# Patient Record
Sex: Male | Born: 1970
Health system: Southern US, Community
[De-identification: ages and names within clinical notes are randomized; demographics above are authoritative.]

## PROBLEM LIST (undated history)

## (undated) DIAGNOSIS — F411 Generalized anxiety disorder: Secondary | ICD-10-CM

## (undated) DIAGNOSIS — G2579 Other drug induced movement disorders: Secondary | ICD-10-CM

## (undated) DIAGNOSIS — F410 Panic disorder [episodic paroxysmal anxiety] without agoraphobia: Secondary | ICD-10-CM

## (undated) DIAGNOSIS — F32A Depression, unspecified: Secondary | ICD-10-CM

## (undated) DIAGNOSIS — F329 Major depressive disorder, single episode, unspecified: Secondary | ICD-10-CM

## (undated) HISTORY — DX: Depression, unspecified: F32.A

## (undated) HISTORY — DX: Panic disorder (episodic paroxysmal anxiety): F41.0

## (undated) HISTORY — DX: Generalized anxiety disorder: F41.1

## (undated) HISTORY — DX: Major depressive disorder, single episode, unspecified: F32.9

## (undated) HISTORY — DX: Other drug induced movement disorders: G25.79

---

## 2014-05-23 ENCOUNTER — Ambulatory Visit (INDEPENDENT_AMBULATORY_CARE_PROVIDER_SITE_OTHER): Payer: 59 | Admitting: Psychology

## 2014-05-23 DIAGNOSIS — F411 Generalized anxiety disorder: Secondary | ICD-10-CM

## 2014-05-23 DIAGNOSIS — F332 Major depressive disorder, recurrent severe without psychotic features: Secondary | ICD-10-CM

## 2014-05-23 DIAGNOSIS — F4001 Agoraphobia with panic disorder: Secondary | ICD-10-CM

## 2014-06-20 ENCOUNTER — Encounter: Payer: Self-pay | Admitting: Family Medicine

## 2014-06-20 ENCOUNTER — Ambulatory Visit (INDEPENDENT_AMBULATORY_CARE_PROVIDER_SITE_OTHER): Payer: 59 | Admitting: Family Medicine

## 2014-06-20 VITALS — BP 129/82 | HR 73 | Temp 98.7°F | Resp 18 | Ht 67.0 in | Wt 146.0 lb

## 2014-06-20 DIAGNOSIS — F411 Generalized anxiety disorder: Secondary | ICD-10-CM

## 2014-06-20 DIAGNOSIS — F331 Major depressive disorder, recurrent, moderate: Secondary | ICD-10-CM

## 2014-06-20 DIAGNOSIS — F410 Panic disorder [episodic paroxysmal anxiety] without agoraphobia: Secondary | ICD-10-CM

## 2014-06-20 MED ORDER — CLONAZEPAM 1 MG PO TBDP: ORAL_TABLET | ORAL | Status: DC

## 2014-06-20 MED ORDER — PAROXETINE HCL 30 MG PO TABS: 30.0000 mg | ORAL_TABLET | Freq: Every day | ORAL | Status: DC

## 2014-06-20 NOTE — Progress Notes (Signed)
Pre visit review using our clinic review tool, if applicable. No additional management support is needed unless otherwise documented below in the visit note. 

## 2014-06-20 NOTE — Progress Notes (Signed)
Office Note 06/20/2014  CC:  Chief Complaint  Patient presents with  . Establish Care    transfer Dr. Loreta AveMann, [facility]    HPI:  Allen Conley is a 43 y.o. White male who is here to establish care, discuss panic attacks. Patient's most recent primary MD: [facility] in [place], [place]. Old records were not reviewed prior to or during today's visit.  He describes a couple year history of episodes of severe anxiety that builds rapidly and starts giving him SOB, tremulousness, feeling of impending doom, sx's peak in 20 min or so, resolve completely in < 1 hr. Worse when he has nothing to do or nothing to keep his mind busy.  Seems to be worse for last couple of months. No adverse life circumstances that patient is worried or depressed about.  Also feeling long hx (years) of persistently sad/depressed mood, crying spells, hopelessness, also chronic worry, poor self esteem.  No hx of euphoria/hypomania/mania but admits to irritability and some excessive anger at times. Has not seen a psychiatrist in the past.  Started seeing a counselor with [facility] in [place]--has a f/u appt scheduled.  Says CPE with labs and EKG about 2 months ago was normal [facility]).   Past Medical History  Diagnosis Date  . Depression     zoloft no help   . Panic attacks   . GAD (generalized anxiety disorder)     History reviewed. No pertinent past surgical history.  Family History  Problem Relation Age of Onset  . Cancer Maternal Grandmother   . Cancer Maternal Grandfather   . Depression Mother     History   Social History  . Marital Status: Single    Spouse Name: N/A    Number of Children: N/A  . Years of Education: N/A   Occupational History  . Not on file.   Social History Main Topics  . Smoking status: Never Smoker   . Smokeless tobacco: Never Used  . Alcohol Use: Yes     Comment: socially  . Drug Use: No  . Sexual Activity: Not on file   Other Topics Concern  . Not on  file   Social History Narrative   Single, no children.   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   OCC: Regulatory affairs officernventory analyst for [employer]   Lives in [place], [place].   No tobacco, occ alcohol (beer).   No hx of alc abuse or drug use.   Exercise: lifts weights 5 d/week, runs Sat/Sun   MEDS: sertraline 50mg , 2 tabs po qd  No Known Allergies  ROS As per HPI PE; Blood pressure 129/82, pulse 73, temperature 98.7 F (37.1 C), temperature source Temporal, resp. rate 18, height 5\' 7"  (1.702 m), weight 146 lb (66.225 kg), SpO2 98.00%. Gen: Alert, well appearing.  Patient is oriented to person, place, time, and situation. AFFECT: pleasant, lucid thought and speech. WUJ:WJXBENT:Eyes: no injection, icteris, swelling, or exudate.  EOMI, PERRLA. Mouth: lips without lesion/swelling.  Oral mucosa pink and moist. Oropharynx without erythema, exudate, or swelling.  Neck - No masses or thyromegaly or limitation in range of motion CV: RRR, no m/r/g.   LUNGS: CTA bilat, nonlabored resps, good aeration in all lung fields. EXT: no clubbing, cyanosis, or edema.  Neuro: CN 2-12 intact bilaterally, strength 5/5 in proximal and distal upper extremities and lower extremities bilaterally.  No sensory deficits.  Trace UE tremor noted in fingers when arms held outstretched.  No disdiadochokinesis.  No ataxia.  Upper extremity and lower  extremity DTRs symmetric.  No pronator drift.  Pertinent labs:  None today  ASSESSMENT AND PLAN:   New pt: obtain old records.  Major depression, moderate, recurrent. Also with GAD with panic disorder.  Plan is to continue with the counselor he recently established with at [facility]. Ween off zoloft over the next 10d:Take one 50mg  zoloft tab once a day for 5d, then take 1/2 of 50mg  zoloft tab once a day for 5d, then stop zoloft. Start paroxetine 30mg  qd (now). Discussed prn use of clonazepam 1mg / 1/2-1 tab bid on his bad anxiety days. Therapeutic expectations and side effect profile of medications discussed today.   Patient's questions answered.  An After Visit Summary was printed and given to the patient.  Return in about 3 weeks (around 07/11/2014) for f/u mood d/o and anxiety d/o.

## 2014-06-20 NOTE — Patient Instructions (Signed)
Take one 50mg  zoloft tab once a day for 5d, then take 1/2 of 50mg  zoloft tab once a day for 5d, then stop zoloft.

## 2014-07-01 ENCOUNTER — Ambulatory Visit (INDEPENDENT_AMBULATORY_CARE_PROVIDER_SITE_OTHER): Payer: 59 | Admitting: Psychology

## 2014-07-01 DIAGNOSIS — F332 Major depressive disorder, recurrent severe without psychotic features: Secondary | ICD-10-CM

## 2014-07-01 DIAGNOSIS — F4001 Agoraphobia with panic disorder: Secondary | ICD-10-CM

## 2014-07-01 DIAGNOSIS — F411 Generalized anxiety disorder: Secondary | ICD-10-CM

## 2014-07-12 ENCOUNTER — Ambulatory Visit (INDEPENDENT_AMBULATORY_CARE_PROVIDER_SITE_OTHER): Payer: 59 | Admitting: Family Medicine

## 2014-07-12 ENCOUNTER — Encounter: Payer: Self-pay | Admitting: Family Medicine

## 2014-07-12 ENCOUNTER — Ambulatory Visit: Payer: PRIVATE HEALTH INSURANCE | Admitting: Family Medicine

## 2014-07-12 VITALS — BP 142/86 | HR 66 | Temp 99.0°F | Resp 18 | Ht 67.0 in | Wt 150.0 lb

## 2014-07-12 DIAGNOSIS — F410 Panic disorder [episodic paroxysmal anxiety] without agoraphobia: Secondary | ICD-10-CM

## 2014-07-12 DIAGNOSIS — F331 Major depressive disorder, recurrent, moderate: Secondary | ICD-10-CM

## 2014-07-12 DIAGNOSIS — F411 Generalized anxiety disorder: Secondary | ICD-10-CM

## 2014-07-12 DIAGNOSIS — F329 Major depressive disorder, single episode, unspecified: Secondary | ICD-10-CM | POA: Insufficient documentation

## 2014-07-12 MED ORDER — PAROXETINE HCL 30 MG PO TABS: ORAL_TABLET | ORAL | Status: DC

## 2014-07-12 MED ORDER — CLONAZEPAM 1 MG PO TBDP: ORAL_TABLET | ORAL | Status: DC

## 2014-07-12 NOTE — Progress Notes (Signed)
OFFICE NOTE  07/12/2014  CC:  Chief Complaint  Patient presents with  . Follow-up  . Depression    increase paxil?     HPI: Patient is a 43 y.o. Caucasian male who is here for 3 wk f/u anxiety. Started paxil 30mg  qd and weened him off zoloft last visit. Also started clonaz bid PRN.   He went to his 2nd counseling appt, has another f/u. Had recent breakup with GF and this has him very down, hopeless, constantly thinking about it and worrying about everything.  He feels like he won't ever feel better.  Denies suicidal thoughts or plans. Clonaz 1 and 1/2 mg helps but less doesn't.  He has run out of this med.   Pertinent PMH:  Past medical, surgical, social, and family history reviewed and no changes are noted since last office visit.  MEDS:  Outpatient Prescriptions Prior to Visit  Medication Sig Dispense Refill  . clonazePAM (KLONOPIN) 1 MG disintegrating tablet 1/2-1 tab po bid prn anxiety/panic  30 tablet  0  . PARoxetine (PAXIL) 30 MG tablet Take 1 tablet (30 mg total) by mouth daily.  30 tablet  1   No facility-administered medications prior to visit.    PE: Blood pressure 142/86, pulse 66, temperature 99 F (37.2 C), temperature source Temporal, resp. rate 18, height 5\' 7"  (1.702 m), weight 150 lb (68.04 kg), SpO2 97.00%. Gen: Alert, well appearing.  Patient is oriented to person, place, time, and situation. AFFECT: nervous, eye contact is good, talks constantly about how he doesn't think he'll ever feel better. No crying.  Lucid thought and conversation.    IMPRESSION AND PLAN:  Major depression and GAD with panic. Recent life circumstance has caused acute worsening.  Gave emotional support and discussed coping strategies. Will also increase paxil to 60 mg daily and clonaz to 1-2 mg bid prn. Keep counseling appt next week, also f/u here with me in 1 wk.  An After Visit Summary was printed and given to the patient.

## 2014-07-12 NOTE — Patient Instructions (Signed)
BE AWARE OF YOUR "SELF TALK".  Start to identify the truth or LACK of truth in your self talk.  Start practicing POSITIVE self talk.  PRACTICE PRACTICE PRACTICE.

## 2014-07-12 NOTE — Progress Notes (Signed)
Pre visit review using our clinic review tool, if applicable. No additional management support is needed unless otherwise documented below in the visit note. 

## 2014-07-19 ENCOUNTER — Ambulatory Visit (INDEPENDENT_AMBULATORY_CARE_PROVIDER_SITE_OTHER): Payer: 59 | Admitting: Family Medicine

## 2014-07-19 ENCOUNTER — Encounter: Payer: Self-pay | Admitting: Family Medicine

## 2014-07-19 VITALS — BP 121/73 | HR 73 | Temp 99.0°F | Resp 18 | Ht 67.0 in | Wt 148.0 lb

## 2014-07-19 DIAGNOSIS — F411 Generalized anxiety disorder: Secondary | ICD-10-CM

## 2014-07-19 DIAGNOSIS — F331 Major depressive disorder, recurrent, moderate: Secondary | ICD-10-CM

## 2014-07-19 DIAGNOSIS — F410 Panic disorder [episodic paroxysmal anxiety] without agoraphobia: Secondary | ICD-10-CM

## 2014-07-19 NOTE — Progress Notes (Signed)
Pre visit review using our clinic review tool, if applicable. No additional management support is needed unless otherwise documented below in the visit note. 

## 2014-07-19 NOTE — Progress Notes (Signed)
OFFICE NOTE  07/19/2014  CC:  Chief Complaint  Patient presents with  . Follow-up  . Medication Problem    may need to increase Paxil    HPI: Patient is a 43 y.o. Caucasian male who is here for 1 week f/u anxiety/depression.   Last week I increased his paxil to 60mg  qd and increased the dosing of his clonazepam, however b/c of confusion with pharmacy he has still only been on 30mg  paxil qd.  The nervousness/anxiety is helped somewhat by the clonaz when he takes 2 of the 1mg  tabs.   Still feeling very down/hopeless, sleeping more, eating less, drinking alcohol more (3-4 times per week). He drinks to get a buzz, doesn't get drunk or black out.  Denies drug use. Denies suicidal thoughts or homicidal thoughts.  Pertinent PMH:  Past medical, surgical, social, and family history reviewed and no changes are noted since last office visit.  MEDS:  Outpatient Prescriptions Prior to Visit  Medication Sig Dispense Refill  . clonazePAM (KLONOPIN) 1 MG disintegrating tablet 1/2-1 tab po bid prn anxiety/panic  60 tablet  0  . PARoxetine (PAXIL) 30 MG tablet 2 tabs po qd  60 tablet  1   No facility-administered medications prior to visit.    PE: Blood pressure 121/73, pulse 73, temperature 99 F (37.2 C), temperature source Temporal, resp. rate 18, height 5\' 7"  (1.702 m), weight 148 lb (67.132 kg), SpO2 98.00%. Gen: Alert, well appearing.  Patient is oriented to person, place, time, and situation. AFFECT: flat, no crying.  Thoughts and conversation are lucid.   IMPRESSION AND PLAN:  Depression and anxiety/panic. Still struggling but he needs to increase paxil to 60 qd as we intended to last week. May take clonaz 1mg  tid, occ may take 2mg  dose after he has been on 1mg  dosing for 5d or so. Advised pt to NOT drink alcohol right now. Continue with counselor.  Start journaling. An After Visit Summary was printed and given to the patient.   FOLLOW UP: 2 wks

## 2014-07-26 ENCOUNTER — Encounter (HOSPITAL_COMMUNITY): Payer: Self-pay | Admitting: Psychology

## 2014-07-26 ENCOUNTER — Ambulatory Visit (INDEPENDENT_AMBULATORY_CARE_PROVIDER_SITE_OTHER): Payer: 59 | Admitting: Psychology

## 2014-07-26 DIAGNOSIS — F4001 Agoraphobia with panic disorder: Secondary | ICD-10-CM

## 2014-07-26 DIAGNOSIS — F411 Generalized anxiety disorder: Secondary | ICD-10-CM

## 2014-07-26 DIAGNOSIS — F332 Major depressive disorder, recurrent severe without psychotic features: Secondary | ICD-10-CM

## 2014-07-30 ENCOUNTER — Encounter (HOSPITAL_COMMUNITY): Payer: Self-pay | Admitting: Psychology

## 2014-07-30 NOTE — Progress Notes (Signed)
   PROGRESS NOTE     Patient:  Allen Conley   DOB: 1971-04-28  MR Number: [id number]  Location: [facility] [address] [place] [address] Dept: [phone]  Start: 10 AM End: 11 AM  Provider/Observer:     Hershal CoriaJohn R Rodenbough PSYD  Chief Complaint:      Chief Complaint  Patient presents with  . Anxiety  . Depression  . Panic Attack    Reason For Service:    The patient was referred for psychotherapeutic interventions for depression and anxiety. He is a 43 year old Caucasian male who has been dealing with anxiety significant 5 or more years. The patient describes depression and anxiety as well as racing thoughts and feelings of hopelessness. The patient reports were gradually to catch the people too quickly and these relationships fall apart. He also describes symptoms consistent with panic attacks in the past and it resurface. The patient reports that he always lived with his grandmother and then when the patient was 43 years old his grandmother started getting sick. The patient to stay in her home and take care of her and she was very sick and not wanting to move to a nursing home. The grandmother passed away 2 years ago and since that time the patient has been laid off from work. Do to financial difficulties from his loss of job need to move in with his mother and family. The patient reports that the situation was very chaotic but that he has been able to move out he was on more recently. Currently, he reports he got a good job and is living on his on doing okay financially.   Interventions Strategy:  Cognitive/behavioral psychotherapeutic interventions  Participation Level:   Active  Participation Quality:  Appropriate      Behavioral Observation:  Well Groomed, Alert, and Appropriate.   Current Psychosocial Factors: The patient reports that he has had more panic anxiety recently has  begun working therapeutic interventions we have been developing. The patient reports that he has been working on his diet, exercise, and sleep patterns as well. The patient reports that he is also been trying to figure out ways that he can develop times where he is around others.  Content of Session:   Review current symptoms and continue to work on therapeutic interventions.  Current Status:   The patient reports increased anxiety and depression have persisted.  Patient Progress:   Stable with active participation by the patient.  Target Goals:   Target goals include reducing the intensity, severity, and duration of symptoms of depression, anxiety and frequency of panic attacks.  Last Reviewed:   07/01/2014  Goals Addressed Today:    Goals addressed today have to do with panic symptoms primarily but also with recurrent symptoms of depression.  Impression/Diagnosis:  The patient has a long history of very poor self-esteem difficult to establish major and significant relationships. The patient reports that his issues of self-esteem leave her vulnerable to try to make quick relationships and confusion around how to establish romantic relationships. The patient has a prior history of panic attacks and this does appear to resurface. He also describes symptoms consistent with recurrent major depression without psychosis as well as generalized anxiety disorder.   Diagnosis:    Axis I: Major depressive disorder, recurrent episode, severe, without mention of psychotic behavior  Generalized anxiety disorder  Panic disorder with agoraphobia

## 2014-07-30 NOTE — Progress Notes (Signed)
PROGRESS NOTE     Patient:  Allen Conley   DOB: February 19, 1971  MR Number: [id number]  Location: BE[facility]IORAL [facility] [address] [place] [address] Dept: [phone]  Start: 10 AM End: 11 AM  Provider/Observer:     Hershal Coria PSYD  Chief Complaint:      Chief Complaint  Patient presents with  . Anxiety  . Depression  . Panic Attack    Reason For Service:    The patient was referred for psychotherapeutic interventions for depression and anxiety. He is a 44 year old Caucasian male who has been dealing with anxiety significant 5 or more years. The patient describes depression and anxiety as well as racing thoughts and feelings of hopelessness. The patient reports were gradually to catch the people too quickly and these relationships fall apart. He also describes symptoms consistent with panic attacks in the past and it resurface. The patient reports that he always lived with his grandmother and then when the patient was 79 years old his grandmother started getting sick. The patient to stay in her home and take care of her and she was very sick and not wanting to move to a nursing home. The grandmother passed away 2 years ago and since that time the patient has been laid off from work. Do to financial difficulties from his loss of job need to move in with his mother and family. The patient reports that the situation was very chaotic but that he has been able to move out he was on more recently. Currently, he reports he got a good job and is living on his on doing okay financially.   Interventions Strategy:  Cognitive/behavioral psychotherapeutic interventions  Participation Level:   Active  Participation Quality:  Appropriate      Behavioral Observation:  Well Groomed, Alert, and Appropriate.   Current Psychosocial Factors: The patient reports that he has had a very challenging interaction  with the friend of his that he had state time with in communicating with at work. About a month ago she started dating someone and while the patient had realized that he was developing some degree of feelings for her and never really thought about trying to ask around her start taking her. However, something happened and she sent him attacks and that she could no longer communicate with him. He did not have any other information and reports a lot of different ideas targets were within his head about what happened. He has not been able to clarify that with her but we did use it is therapeutic starting point..  Content of Session:   Review current symptoms and continue to work on therapeutic interventions.  Current Status:   The patient reports increased anxiety and depression have persisted.  Patient Progress:   Stable with active participation by the patient.  Target Goals:   Target goals include reducing the intensity, severity, and duration of symptoms of depression, anxiety and frequency of panic attacks.  Last Reviewed:   07/26/2014  Goals Addressed Today:    Goals addressed today have to do with panic symptoms primarily but also with recurrent symptoms of depression.  Impression/Diagnosis:  The patient has a long history of very poor self-esteem difficult to establish major and significant relationships. The patient reports that his issues of self-esteem leave her vulnerable to try to make quick relationships and confusion around how to establish romantic relationships. The patient has a prior history of panic  attacks and this does appear to resurface. He also describes symptoms consistent with recurrent major depression without psychosis as well as generalized anxiety disorder.   Diagnosis:    Axis I: Major depressive disorder, recurrent episode, severe, without mention of psychotic behavior  Generalized anxiety disorder  Panic disorder with agoraphobia

## 2014-07-30 NOTE — Progress Notes (Signed)
Patient:   Allen Conley   DOB:   25-Mar-1971  MR Number:  [id number]  Location:  [facility] [address] [place] [id number] Dept: [phone]           Date of Service:   05/23/2014  Start Time:   11 AM End Time:   12 PM  Provider/Observer:  Hershal CoriaJohn R Lakiya Cottam PSYD       Billing Code/Service: [phone]  Chief Complaint:     Chief Complaint  Patient presents with  . Anxiety  . Panic Attack    Reason for Service:  The patient was referred for psychotherapeutic interventions for depression and anxiety. He is a 43 year old Caucasian male who has been dealing with anxiety significant 5 or more years. The patient describes depression and anxiety as well as racing thoughts and feelings of hopelessness. The patient reports were gradually to catch the people too quickly and these relationships fall apart. He also describes symptoms consistent with panic attacks in the past and it resurface. The patient reports that he always lived with his grandmother and then when the patient was 43 years old his grandmother started getting sick. The patient to stay in her home and take care of her and she was very sick and not wanting to move to a nursing home. The grandmother passed away 2 years ago and since that time the patient has been laid off from work. Do to financial difficulties from his loss of job need to move in with his mother and family. The patient reports that the situation was very chaotic but that he has been able to move out he was on more recently. Currently, he reports he got a good job and is living on his on doing okay financially.  Current Status:  The patient describes moderate to significant symptoms of depression, anxiety, mood changes, racing thoughts, loss of interest, excessive worrying, intrusive and obsessive thinking, panic attacks, and other problems with attention and  focus.  Reliability of Information: Information is provided patient as well as review of available medical.  Behavioral Observation: Allen JuneAlex N. Ephriam Conley  presents as a 43 y.o.-year-old Right Caucasian Male who appeared his stated age. his dress was Appropriate and he was Well Groomed and his manners were Appropriate to the situation.  There were not any physical disabilities noted.  he displayed an appropriate level of cooperation and motivation.    Interactions:    Active   Attention:   within normal limits  Memory:   within normal limits  Visuo-spatial:   within normal limits  Speech (Volume):  normal  Speech:   normal pitch and normal volume  Thought Process:  Coherent  Though Content:  Rumination  Orientation:   person, place, time/date and situation  Judgment:   Good  Planning:   Good  Affect:    Anxious and Depressed  Mood:    Anxious and Depressed  Insight:   Good  Intelligence:   normal  Marital Status/Living: [place] He is single and is struggled to establish significant relationships over the years. He continues to see his parents every 2 or 3 weeks. The patient has a 5131 and 41 year old brothers. They're no major family conflicts of note.  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  Past Employment:  Substance Use:  No concerns of substance abuse are reported.  the patient denies any history of substance abuse.  Education:   College  Medical History:   Past Medical History  Diagnosis Date  . Depression     zoloft no help   . Panic attacks   . GAD (generalized anxiety disorder)         No outpatient encounter prescriptions on file as of 05/23/2014.          Sexual History:   History  Sexual Activity  . Sexual Activity: Not on file    Abuse/Trauma History: The patient denies any history of abuse or  trauma.  Psychiatric History:  The patient denies any prior formal psychiatric history but does acknowledge episodes in the past we had some panic attacks that went into remission as well as issues related to significant poor self-esteem and some depressive symptoms. He had not been treated in the past.  Family Med/Psych History:  Family History  Problem Relation Age of Onset  . Cancer Maternal Grandmother   . Cancer Maternal Grandfather   . Depression Mother     Risk of Suicide/Violence: virtually non-existent the patient denies any symptoms of suicidal ideation or homicidal ideation.  Impression/DX:  The patient has a long history of very poor self-esteem difficult to establish major and significant relationships. The patient reports that his issues of self-esteem leave her vulnerable to try to make quick relationships and confusion around how to establish romantic relationships. The patient has a prior history of panic attacks and this does appear to resurface. He also describes symptoms consistent with recurrent major depression without psychosis as well as generalized anxiety disorder.  Disposition/Plan:  We will set the patient up for individual psychotherapeutic interventions and having contact his primary care physician about possible psychotropic interventions.  Diagnosis:    Axis I:  Major depressive disorder, recurrent episode, severe, without mention of psychotic behavior  Generalized anxiety disorder  Panic disorder with agoraphobia

## 2014-08-02 ENCOUNTER — Encounter: Payer: Self-pay | Admitting: Family Medicine

## 2014-08-02 ENCOUNTER — Ambulatory Visit (INDEPENDENT_AMBULATORY_CARE_PROVIDER_SITE_OTHER): Payer: 59 | Admitting: Family Medicine

## 2014-08-02 VITALS — BP 122/72 | HR 68 | Temp 98.6°F | Ht 67.0 in | Wt 152.0 lb

## 2014-08-02 DIAGNOSIS — F410 Panic disorder [episodic paroxysmal anxiety] without agoraphobia: Secondary | ICD-10-CM

## 2014-08-02 DIAGNOSIS — F321 Major depressive disorder, single episode, moderate: Secondary | ICD-10-CM

## 2014-08-02 DIAGNOSIS — F411 Generalized anxiety disorder: Secondary | ICD-10-CM

## 2014-08-02 MED ORDER — CLONAZEPAM 1 MG PO TBDP: ORAL_TABLET | ORAL | Status: DC

## 2014-08-02 MED ORDER — PAROXETINE HCL 30 MG PO TABS: ORAL_TABLET | ORAL | Status: DC

## 2014-08-02 MED ORDER — BUPROPION HCL ER (XL) 150 MG PO TB24: 150.0000 mg | ORAL_TABLET | Freq: Every day | ORAL | Status: DC

## 2014-08-02 NOTE — Progress Notes (Signed)
Pre visit review using our clinic review tool, if applicable. No additional management support is needed unless otherwise documented below in the visit note. 

## 2014-08-02 NOTE — Progress Notes (Signed)
OFFICE NOTE  08/02/2014  CC:  Chief Complaint  Patient presents with  . Follow-up   HPI: Patient is a 43 y.o. Caucasian male who is here for 2 wk f/u depression and anxiety.   Still feeling bad, maybe 5-10 % improved over the last 6 wks while on paxil and getting him up to 60mg  qd dosing. Clonaz helps for anxiety when he starts to get really worked up: he takes 1-2 tabs bid, avg 3 a day. Sleep: sleeps more but he takes this as a bad sign. Appetite: fine. Has thoughts like "if the world ended in 15min that would be fine with me" but denies active suicidal thinking or homicidal thinking. Still seeing counselor, says he last saw them last week, feels like this is helping.  Denies hx of easy distractibility or poor focus. Says the paxil doesn't make him sedated but clonaz does somewhat.  He did start journaling some and has found this helpful as an outlet.  Pertinent PMH:  Past medical, surgical, social, and family history reviewed and no changes are noted since last office visit.  MEDS:  Outpatient Prescriptions Prior to Visit  Medication Sig Dispense Refill  . clonazePAM (KLONOPIN) 1 MG disintegrating tablet 1/2-1 tab po bid prn anxiety/panic  60 tablet  0  . PARoxetine (PAXIL) 30 MG tablet 2 tabs po qd  60 tablet  1   No facility-administered medications prior to visit.    PE: Blood pressure 122/72, pulse 68, temperature 98.6 F (37 C), temperature source Temporal, height 5\' 7"  (1.702 m), weight 152 lb (68.947 kg), SpO2 96.00%. Gen: Alert, well appearing.  Patient is oriented to person, place, time, and situation. AFFECT: pleasant, lucid thought and speech. No further exam today.  IMPRESSION AND PLAN:  Depression and anxiety: minimal improvement on paxil 60mg  qd (6 total weeks on this med) + counseling. Continue current treatments + add wellbutrin XL 150mg  qd. Therapeutic expectations and side effect profile of medication discussed today.  Patient's questions  answered. Clonaz RF today: 1mg , 1-2 tabs bid prn, #90, RF x 1.  An After Visit Summary was printed and given to the patient.  FOLLOW UP: 4 wks

## 2014-08-16 ENCOUNTER — Ambulatory Visit (INDEPENDENT_AMBULATORY_CARE_PROVIDER_SITE_OTHER): Payer: 59 | Admitting: Psychology

## 2014-08-16 DIAGNOSIS — F411 Generalized anxiety disorder: Secondary | ICD-10-CM

## 2014-08-16 DIAGNOSIS — F4001 Agoraphobia with panic disorder: Secondary | ICD-10-CM

## 2014-08-30 ENCOUNTER — Ambulatory Visit: Payer: PRIVATE HEALTH INSURANCE | Admitting: Family Medicine

## 2014-09-06 ENCOUNTER — Encounter: Payer: Self-pay | Admitting: Family Medicine

## 2014-09-06 ENCOUNTER — Ambulatory Visit: Payer: PRIVATE HEALTH INSURANCE | Admitting: Family Medicine

## 2014-09-06 ENCOUNTER — Ambulatory Visit (INDEPENDENT_AMBULATORY_CARE_PROVIDER_SITE_OTHER): Payer: 59 | Admitting: Family Medicine

## 2014-09-06 VITALS — BP 106/66 | HR 70 | Resp 14 | Wt 155.8 lb

## 2014-09-06 DIAGNOSIS — F331 Major depressive disorder, recurrent, moderate: Secondary | ICD-10-CM

## 2014-09-06 DIAGNOSIS — F411 Generalized anxiety disorder: Secondary | ICD-10-CM

## 2014-09-06 MED ORDER — BUPROPION HCL ER (XL) 300 MG PO TB24: 300.0000 mg | ORAL_TABLET | Freq: Every day | ORAL | Status: DC

## 2014-09-06 NOTE — Progress Notes (Signed)
OFFICE NOTE  09/06/2014  CC: f/u depression and anxiety  HPI: Patient is a 43 y.o. Caucasian male who is here for 1 mo f/u depression and anxiety.  Still struggling a lot, BUT he feels like he hasn't been as overwhelmed with depression, "I haven't bottomed-out" since last visit.  Still anhedonic, still lacks the will to live.  No thoughts or plans of hurting himself or others.  He is still keeping his routine of work, still working out, still going to [employer], production.  Says he is journaling and working on self-talk. Denies side effect since starting wellbutrin last visit. Still taking clonaz regularly.  Pertinent PMH:  Past medical, surgical, social, and family history reviewed and no changes are noted since last office visit.  MEDS:  Outpatient Prescriptions Prior to Visit  Medication Sig Dispense Refill  . clonazePAM (KLONOPIN) 1 MG disintegrating tablet 1-2 tabs po bid prn anxiety/panic  90 tablet  1  . PARoxetine (PAXIL) 30 MG tablet 2 tabs po qd  60 tablet  6  . buPROPion (WELLBUTRIN XL) 150 MG 24 hr tablet Take 1 tablet (150 mg total) by mouth daily.  30 tablet  1   No facility-administered medications prior to visit.    PE: Blood pressure 106/66, pulse 70, resp. rate 14, weight 155 lb 12 oz (70.648 kg). Wt Readings from Last 2 Encounters:  09/06/14 155 lb 12 oz (70.648 kg)  08/02/14 152 lb (68.947 kg)   Gen: alert, oriented x 4, affect pleasant.  Lucid thinking and conversation noted. HEENT: PERRLA, EOMI.   Neck: no LAD, mass, or thyromegaly. CV: RRR, no m/r/g LUNGS: CTA bilat, nonlabored. NEURO: no tremor or tics noted on observation.  Coordination intact.  IMPRESSION AND PLAN:  Major depression and GAD, minimal improvement on current regimen.   Discussed options: decided to maximize wellbutrin XL to 300 mg qd, continue paxil at  qd.  Continue clonazepam at current dosing. If not significantly improved in 1 mo, will ween him off current regimen and start MAO-I or  will refer him to psychiatrist.  He'll continue seeing his counselor.  Call 911 or go to nearest ED if suicidal or homicidal thinking begins.  An After Visit Summary was printed and given to the patient.  Flu vaccine IM today.  FOLLOW UP: 1 mo

## 2014-09-12 ENCOUNTER — Encounter: Payer: Self-pay | Admitting: Family Medicine

## 2014-09-12 ENCOUNTER — Ambulatory Visit (INDEPENDENT_AMBULATORY_CARE_PROVIDER_SITE_OTHER): Payer: 59 | Admitting: Family Medicine

## 2014-09-12 VITALS — BP 95/66 | HR 84 | Temp 98.0°F | Resp 16 | Ht 67.0 in | Wt 158.0 lb

## 2014-09-12 DIAGNOSIS — F329 Major depressive disorder, single episode, unspecified: Secondary | ICD-10-CM

## 2014-09-12 DIAGNOSIS — F411 Generalized anxiety disorder: Secondary | ICD-10-CM

## 2014-09-12 DIAGNOSIS — G2579 Other drug induced movement disorders: Secondary | ICD-10-CM

## 2014-09-12 DIAGNOSIS — F32A Depression, unspecified: Secondary | ICD-10-CM

## 2014-09-12 MED ORDER — LORAZEPAM 1 MG PO TABS
ORAL_TABLET | ORAL | Status: DC
Start: 2014-09-12 — End: 2014-09-27

## 2014-09-12 NOTE — Patient Instructions (Signed)
Take one 150mg  wellbutrin XL tab once a day x 1 week, then stop this med. Take one 30mg  paxil tab once a day x 1 week, then take 1/2 of a 30 mg paxil tab once a day x 1 week, then stop this med.

## 2014-09-12 NOTE — Progress Notes (Signed)
OFFICE NOTE  09/12/2014  CC:  Chief Complaint  Patient presents with  . Tremors   HPI: Patient is a 43 y.o. Caucasian male who is here for feeling tremulous. I just saw him 5d/a and increased his wellbutrin to 300mg  qd at that visit.  He says these were occuring at that time but he forgot to mention this.  In fact, he says the tremors began not long after he started on paxil.  For some reason he says he never mentioned this to me.  He says he told his psychologist and he said it was from low morning levels of dopamine. It seems to have worsened quite significantly over the last 1 week.   Feels it worse in UE's than LE's.  Hands can't stay still while trying to type at home or work.   The tremors don't fluctuate with his level of anxiety.  They start upon getting out of bed in the morning, seem to abate as the day goes on.  Takes paxil and wellbutrin in morning when he wakes up around 5:30 AM.   Pertinent PMH:  Past medical, surgical, social, and family history reviewed and no changes are noted since last office visit.  MEDS:  Outpatient Prescriptions Prior to Visit  Medication Sig Dispense Refill  . buPROPion (WELLBUTRIN XL) 300 MG 24 hr tablet Take 1 tablet (300 mg total) by mouth daily.  30 tablet  2  . clonazePAM (KLONOPIN) 1 MG disintegrating tablet 1-2 tabs po bid prn anxiety/panic  90 tablet  1  . PARoxetine (PAXIL) 30 MG tablet 2 tabs po qd  60 tablet  6   No facility-administered medications prior to visit.    PE: Blood pressure 95/66, pulse 84, temperature 98 F (36.7 C), temperature source Oral, resp. rate 16, height 5\' 7"  (1.702 m), weight 158 lb (71.668 kg), SpO2 98.00%. Gen: Alert, well appearing.  Patient is oriented to person, place, time, and situation. ZOX:WRUEENT:Eyes: no injection, icteris, swelling, or exudate.  EOMI, PERRLA. Mouth: lips without lesion/swelling.  Oral mucosa pink and moist. Oropharynx without erythema, exudate, or swelling.  CV: RRR, no m/r/g.   LUNGS:  CTA bilat, nonlabored resps, good aeration in all lung fields. Very fine UE tremor with hands held outstretched, no change with FNF testing.  LE tremor similar.  Strength in UE's and LE's 5/5 prox and dist.  DTRs in UE's 1+ and symmetric.  LE DTRs: Patellar 4+ bilat, achilles 3+ bilat, with 2 beats of clonus No ataxia.     IMPRESSION AND PLAN:  Serotonin syndrome Ween off paxil and Wellbutrin XL: Take one 150mg  wellbutrin XL tab once a day x 1 week, then stop this med. Take one 30mg  paxil tab once a day x 1 week, then take 1/2 of a 30 mg paxil tab once a day x 1 week, then stop this med.  Change clonazepam to lorazepam 1mg , 1-2 tabs po bid prn, #120. Signs/symptoms to call or return for were reviewed and pt expressed understanding. Refer to psychiatrist for ongoing management of his anxiety/panic/depression.   An After Visit Summary was printed and given to the patient.  FOLLOW UP: 1 wk

## 2014-09-17 DIAGNOSIS — G9081 Serotonin syndrome: Secondary | ICD-10-CM

## 2014-09-17 DIAGNOSIS — G2579 Other drug induced movement disorders: Secondary | ICD-10-CM | POA: Insufficient documentation

## 2014-09-17 HISTORY — DX: Other drug induced movement disorders: G25.79

## 2014-09-17 HISTORY — DX: Serotonin syndrome: G90.81

## 2014-09-17 NOTE — Assessment & Plan Note (Addendum)
Ween off paxil and Wellbutrin XL: Take one 150mg  wellbutrin XL tab once a day x 1 week, then stop this med. Take one 30mg  paxil tab once a day x 1 week, then take 1/2 of a 30 mg paxil tab once a day x 1 week, then stop this med.  Change clonazepam to lorazepam 1mg , 1-2 tabs po bid prn, #120. Signs/symptoms to call or return for were reviewed and pt expressed understanding. Refer to psychiatrist for ongoing management of his anxiety/panic/depression.

## 2014-09-20 ENCOUNTER — Ambulatory Visit: Payer: PRIVATE HEALTH INSURANCE | Admitting: Family Medicine

## 2014-09-20 ENCOUNTER — Ambulatory Visit: Payer: 59 | Admitting: Family Medicine

## 2014-09-26 ENCOUNTER — Encounter (HOSPITAL_COMMUNITY): Payer: Self-pay | Admitting: Psychology

## 2014-09-26 ENCOUNTER — Ambulatory Visit: Payer: 59 | Admitting: Family Medicine

## 2014-09-26 NOTE — Progress Notes (Signed)
   PROGRESS NOTE     Patient:  Allen Conley   DOB: 1971/05/13  MR Number: [id number]  [facility] [address] [place] [address] Dept: [phone]  Start: 10 AM End: 11 AM  Provider/Observer:     Hershal CoriaJohn R Rodenbough PSYD  Chief Complaint:      Chief Complaint  Patient presents with  . Depression    Reason For Service:    The patient was referred for psychotherapeutic interventions for depression and anxiety. He is a 43 year old Caucasian male who has been dealing with anxiety significant 5 or more years. The patient describes depression and anxiety as well as racing thoughts and feelings of hopelessness. The patient reports were gradually to catch the people too quickly and these relationships fall apart. He also describes symptoms consistent with panic attacks in the past and it resurface. The patient reports that he always lived with his grandmother and then when the patient was 89 years old his grandmother started getting sick. The patient to stay in her home and take care of her and she was very sick and not wanting to move to a nursing home. The grandmother passed away 2 years ago and since that time the patient has been laid off from work. Do to financial difficulties from his loss of job need to move in with his mother and family. The patient reports that the situation was very chaotic but that he has been able to move out he was on more recently. Currently, he reports he got a good job and is living on his on doing okay financially.   Interventions Strategy:  Cognitive/behavioral psychotherapeutic interventions  Participation Level:   Active  Participation Quality:  Appropriate      Behavioral Observation:  Well Groomed, Alert, and Appropriate.   Current Psychosocial Factors: The patient reports that he has been coping better with the situation between the woman that he  began developing feelings with not communicating with her and then she started dating someone else. The patient reports that he has been actively working on therapeutic interventions..  Content of Session:   Review current symptoms and continue to work on therapeutic interventions.  Current Status:   The patient reports that the depression anxiety is improving recently and is he has been actively working on therapeutic interventions..  Patient Progress:   Stable with active participation by the patient.  Target Goals:   Target goals include reducing the intensity, severity, and duration of symptoms of depression, anxiety and frequency of panic attacks.  Last Reviewed:   08/16/2014  Goals Addressed Today:    Goals addressed today have to do with panic symptoms primarily but also with recurrent symptoms of depression.  Impression/Diagnosis:  The patient has a long history of very poor self-esteem difficult to establish major and significant relationships. The patient reports that his issues of self-esteem leave her vulnerable to try to make quick relationships and confusion around how to establish romantic relationships. The patient has a prior history of panic attacks and this does appear to resurface. He also describes symptoms consistent with recurrent major depression without psychosis as well as generalized anxiety disorder.   Diagnosis:    Axis I: Generalized anxiety disorder  Panic disorder with agoraphobia

## 2014-09-27 ENCOUNTER — Encounter: Payer: Self-pay | Admitting: Family Medicine

## 2014-09-27 ENCOUNTER — Ambulatory Visit (INDEPENDENT_AMBULATORY_CARE_PROVIDER_SITE_OTHER): Payer: 59 | Admitting: Family Medicine

## 2014-09-27 VITALS — BP 101/65 | HR 76 | Temp 98.0°F | Resp 18 | Ht 67.0 in | Wt 153.0 lb

## 2014-09-27 DIAGNOSIS — F410 Panic disorder [episodic paroxysmal anxiety] without agoraphobia: Secondary | ICD-10-CM

## 2014-09-27 DIAGNOSIS — F331 Major depressive disorder, recurrent, moderate: Secondary | ICD-10-CM

## 2014-09-27 DIAGNOSIS — F411 Generalized anxiety disorder: Secondary | ICD-10-CM

## 2014-09-27 DIAGNOSIS — G2579 Other drug induced movement disorders: Secondary | ICD-10-CM

## 2014-09-27 MED ORDER — LORAZEPAM 1 MG PO TABS: ORAL_TABLET | ORAL | Status: DC

## 2014-09-27 MED ORDER — ARIPIPRAZOLE 5 MG PO TABS: ORAL_TABLET | ORAL | Status: DC

## 2014-09-27 NOTE — Progress Notes (Signed)
OFFICE NOTE  09/27/2014  CC:  Chief Complaint  Patient presents with  . Follow-up   HPI: Patient is a 43 y.o. Caucasian male who is here for 2 week f/u severe anxiety and major depression. Last visit he had serotonin syndrome and I had to ween him off his paxil and wellbutrin.  I started lorazepam for prn use.  I referred him to a psychiatrist (Dr. Foye ClockKaur)--earliest appt not until March 2015. "Feels like we're at square 1 now" regarding anxiety, near panic attacks, mood is depressed and sad, hopeless, anhedonia.  Sleeping more than typical, appetite is down.  Lots of trouble concentrating at work, esp on paperwork and things that take focus. Says tremors are minimal at this point.  Pertinent PMH:  Past surgical, social, and family history reviewed and no changes noted since last office visit.  MEDS:  Outpatient Prescriptions Prior to Visit  Medication Sig Dispense Refill  . LORazepam (ATIVAN) 1 MG tablet 1-2 tabs po bid prn  120 tablet  0   No facility-administered medications prior to visit.    PE: Blood pressure 101/65, pulse 76, temperature 98 F (36.7 C), temperature source Oral, resp. rate 18, height 5\' 7"  (1.702 m), weight 153 lb (69.4 kg), SpO2 99.00%. Gen: Alert, well appearing.  Patient is oriented to person, place, time, and situation. AFFECT: a bit morose, lucid thought and speech. No tremors. CV: RRR, no m/r/g LE DTRs: patellar 1+ bila. Achilles 1+ bilat, with 1 beat of clonus (induced).   IMPRESSION AND PLAN:  Mood disorder NOS (bipolar depression) vs unipolar depression with irritability and high anxiety state. Serotonin syndrome with paxil+wellbutrin combo. He is much improved from this.   Will do trial of low dose abilify, 1/2 of 5mg  qd x 5d, then whole 5 mg tab qd. He can increase his ativan use to 2mg  q6h prn.  STRICT WARNINGS GIVEN TO PT ABOUT POTENTIAL SIDE EFFECTS OF THESE MEDS, INCLUDING SEDATION/IMPAIRMENT/WORSENING MOOD OR ANXIETY.  At this point, I  am trying to get him in with a psychiatrist but due to supply/demand this is a very difficult proposition to carry out in less than 3-5 months.  An After Visit Summary was printed and given to the patient.  FOLLOW UP: 2 wks.

## 2014-09-27 NOTE — Patient Instructions (Signed)
Start with 1/2 of your abilify 5mg  tab once daily for 5d, then increase to a whole 5mg  tab once a day.

## 2014-09-27 NOTE — Progress Notes (Signed)
Pre visit review using our clinic review tool, if applicable. No additional management support is needed unless otherwise documented below in the visit note. 

## 2014-09-30 ENCOUNTER — Telehealth: Payer: Self-pay | Admitting: Family Medicine

## 2014-09-30 DIAGNOSIS — F332 Major depressive disorder, recurrent severe without psychotic features: Secondary | ICD-10-CM

## 2014-09-30 NOTE — Telephone Encounter (Signed)
Called pharmacy to clarify.  Pharmacy did get RX for abilify but pt's copay is 361.30.  Please advise if there is something else to RX.

## 2014-09-30 NOTE — Telephone Encounter (Signed)
Patient said the drugstore did not have Rx today

## 2014-10-01 MED ORDER — LITHIUM CARBONATE 300 MG PO CAPS: 300.0000 mg | ORAL_CAPSULE | Freq: Two times a day (BID) | ORAL | Status: DC

## 2014-10-01 NOTE — Telephone Encounter (Signed)
LMOM for pt to CB.  

## 2014-10-01 NOTE — Telephone Encounter (Addendum)
Patient aware and is agreeable to starting lithium.  Lab [facility]rers [facility] and [facility] [place].   Patient will try to get by there today or tomorrow morning and he understands not begin lithium until after getting his TSH and BMET checked.

## 2014-10-01 NOTE — Telephone Encounter (Signed)
Ok. I want to start him on lithium--it is generic and inexpensive. He needs a few blood tests first: he can either come here for lab visit or go to [facility] [place] (pls order BMET and TSH, dx is major depressive disorder, recurrent, moderate--should be in problem list already). If pt agreeable to starting this let me know and I'll send in rx to his pharmacy.  Have him come in for o/v in 7-10d for morning visit so we can check a lithium level in the blood---tell him NOT TO TAKE HIS LITHIUM THE MORNING THAT HE IS SUPPOSED TO COME IN FOR HIS LITHIUM LEVEL CHECK.-thx

## 2014-10-01 NOTE — Telephone Encounter (Signed)
OK. Lithium rx sent to his pharmacy. OK to start ASAP---doesn't have to wait until BMET and TSH results are back.-thx

## 2014-10-03 MED ORDER — LORAZEPAM 1 MG PO TABS: ORAL_TABLET | ORAL | Status: DC

## 2014-10-03 NOTE — Telephone Encounter (Signed)
Ativan rx printed. 

## 2014-10-03 NOTE — Telephone Encounter (Signed)
Pt would like a refill on Ativan since his dose was increased to 2 tablets every 6 hours. He is now running out. Can pt have a refill? Please advise.

## 2014-10-03 NOTE — Telephone Encounter (Signed)
[facility]

## 2014-10-09 ENCOUNTER — Other Ambulatory Visit: Payer: Self-pay | Admitting: Family Medicine

## 2014-10-09 ENCOUNTER — Other Ambulatory Visit: Payer: Self-pay

## 2014-10-09 DIAGNOSIS — F332 Major depressive disorder, recurrent severe without psychotic features: Secondary | ICD-10-CM

## 2014-10-10 LAB — COMPREHENSIVE METABOLIC PANEL
ALK PHOS: 72 U/L (ref 39–117)
ALT: 18 U/L (ref 0–53)
AST: 30 U/L (ref 0–37)
Albumin: 4.7 g/dL (ref 3.5–5.2)
BILIRUBIN TOTAL: 0.4 mg/dL (ref 0.2–1.2)
BUN: 26 mg/dL — ABNORMAL HIGH (ref 6–23)
CO2: 26 mEq/L (ref 19–32)
Calcium: 9.3 mg/dL (ref 8.4–10.5)
Chloride: 101 mEq/L (ref 96–112)
Creat: 1.12 mg/dL (ref 0.50–1.35)
Glucose, Bld: 75 mg/dL (ref 70–99)
Potassium: 3.9 mEq/L (ref 3.5–5.3)
SODIUM: 137 meq/L (ref 135–145)
TOTAL PROTEIN: 7.2 g/dL (ref 6.0–8.3)

## 2014-10-10 LAB — BASIC METABOLIC PANEL
BUN: 27 mg/dL — AB (ref 6–23)
CHLORIDE: 101 meq/L (ref 96–112)
CO2: 27 mEq/L (ref 19–32)
Calcium: 9.4 mg/dL (ref 8.4–10.5)
Creat: 1.17 mg/dL (ref 0.50–1.35)
GLUCOSE: 75 mg/dL (ref 70–99)
Potassium: 3.9 mEq/L (ref 3.5–5.3)
Sodium: 138 mEq/L (ref 135–145)

## 2014-10-10 LAB — TSH: TSH: 1.354 u[IU]/mL (ref 0.350–4.500)

## 2014-10-11 ENCOUNTER — Ambulatory Visit: Payer: PRIVATE HEALTH INSURANCE | Admitting: Family Medicine

## 2014-10-11 ENCOUNTER — Ambulatory Visit: Payer: Self-pay | Admitting: Family Medicine

## 2014-10-14 ENCOUNTER — Ambulatory Visit: Payer: 59 | Admitting: Family Medicine

## 2014-10-16 ENCOUNTER — Ambulatory Visit: Payer: 59 | Admitting: Family Medicine

## 2014-10-18 ENCOUNTER — Ambulatory Visit (INDEPENDENT_AMBULATORY_CARE_PROVIDER_SITE_OTHER): Payer: 59 | Admitting: Family Medicine

## 2014-10-18 ENCOUNTER — Other Ambulatory Visit: Payer: Self-pay | Admitting: Family Medicine

## 2014-10-18 ENCOUNTER — Encounter: Payer: Self-pay | Admitting: Family Medicine

## 2014-10-18 VITALS — BP 125/78 | HR 53 | Temp 98.2°F | Resp 18 | Ht 67.0 in | Wt 153.0 lb

## 2014-10-18 DIAGNOSIS — F411 Generalized anxiety disorder: Secondary | ICD-10-CM

## 2014-10-18 DIAGNOSIS — Z79899 Other long term (current) drug therapy: Secondary | ICD-10-CM

## 2014-10-18 DIAGNOSIS — F3181 Bipolar II disorder: Secondary | ICD-10-CM

## 2014-10-18 NOTE — Progress Notes (Signed)
Pre visit review using our clinic review tool, if applicable. No additional management support is needed unless otherwise documented below in the visit note. 

## 2014-10-18 NOTE — Progress Notes (Signed)
OFFICE NOTE  10/18/2014  CC:  Chief Complaint  Patient presents with  . Follow-up     HPI: Patient is a 43 y.o. Caucasian male who is here for 1 wk f/u mood disorder NOS, severe anxiety, recently started him on lithium 300 mg bid.  Has been on the lithium 2-3 wks now, feels a tolerable level of peri-orbital HA plus mild queezy/vertigo feeling. Nothing severe.  He says he does not feel impaired by these side effects.  Feels no change in mood at this time. Anxiety level still severe, needing ativan 2 tabs usually 2-4 times a day.  He says this helps his anxiety and does not make his mood any worse.   Has psychiatrist appt at the end of this month: [facility]  Sees psychologist in 3d.   Pertinent PMH:  Past medical, surgical, social, and family history reviewed and no changes are noted since last office visit.  MEDS:  Outpatient Prescriptions Prior to Visit  Medication Sig Dispense Refill  . lithium carbonate 300 MG capsule Take 1 capsule (300 mg total) by mouth 2 (two) times daily with a meal. 60 capsule 0  . LORazepam (ATIVAN) 1 MG tablet 1-2 tabs po qid prn anxiety 240 tablet 0   No facility-administered medications prior to visit.    PE: Blood pressure 125/78, pulse 53, temperature 98.2 F (36.8 C), temperature source Temporal, resp. rate 18, height 5\' 7"  (1.702 m), weight 153 lb (69.4 kg), SpO2 100 %. Gen: Alert, well appearing.  Patient is oriented to person, place, time, and situation. AFFECT: affect is a bit nervous, smiles some.  No crying.  Eye contact is decent.  LABS: none today Recent:  Lab Results  Component Value Date   TSH 1.354 10/09/2014     Chemistry      Component Value Date/Time   NA 137 10/09/2014 1638   K 3.9 10/09/2014 1638   CL 101 10/09/2014 1638   CO2 26 10/09/2014 1638   BUN 26* 10/09/2014 1638   CREATININE 1.12 10/09/2014 1638      Component Value Date/Time   CALCIUM 9.3 10/09/2014 1638   ALKPHOS 72 10/09/2014 1638    AST 30 10/09/2014 1638   ALT 18 10/09/2014 1638   BILITOT 0.4 10/09/2014 1638      IMPRESSION AND PLAN:  Bipolar d/o vs intractable unipolar depression. Hx of serotonin syndrome on wellbutrin and paxil--resolved with ween off these meds. No improvement at this time from approx 2+ wks of lithium 300 mg bid.  Mild side effects likely from lithium--wait this out. Check lithium trough ASAP---[facility] [place].  Anxiety fairly well controlled on ativan at this time.  An After Visit Summary was printed and given to the patient.  FOLLOW UP: none with me.  His next check will be his first appt with his psychiatrist at the end of this month. Call for any medication problems.  Return prn.

## 2014-10-20 LAB — LITHIUM LEVEL: Lithium Lvl: 0.2 mEq/L — ABNORMAL LOW (ref 0.80–1.40)

## 2014-10-21 ENCOUNTER — Ambulatory Visit (INDEPENDENT_AMBULATORY_CARE_PROVIDER_SITE_OTHER): Payer: 59 | Admitting: Psychology

## 2014-10-21 DIAGNOSIS — F4001 Agoraphobia with panic disorder: Secondary | ICD-10-CM

## 2014-10-21 DIAGNOSIS — F411 Generalized anxiety disorder: Secondary | ICD-10-CM

## 2014-10-22 ENCOUNTER — Encounter (HOSPITAL_COMMUNITY): Payer: Self-pay | Admitting: Psychology

## 2014-11-01 ENCOUNTER — Encounter (HOSPITAL_COMMUNITY): Payer: Self-pay | Admitting: Psychology

## 2014-11-01 NOTE — Progress Notes (Signed)
PROGRESS NOTE     Patient:  Allen Conley   DOB: 1971-01-08  MR Number: [id number]  [facility] [address] [place] [address] Dept: [phone]  Start: 10 AM End: 11 AM  Provider/Observer:     Hershal CoriaJohn R Telesforo Brosnahan PSYD  Chief Complaint:      Chief Complaint  Patient presents with  . Anxiety  . Depression    Reason For Service:    The patient was referred for psychotherapeutic interventions for depression and anxiety. He is a 43 year old Caucasian male who has been dealing with anxiety significant 5 or more years. The patient describes depression and anxiety as well as racing thoughts and feelings of hopelessness. The patient reports were gradually to catch the people too quickly and these relationships fall apart. He also describes symptoms consistent with panic attacks in the past and it resurface. The patient reports that he always lived with his grandmother and then when the patient was 43 years old his grandmother started getting sick. The patient to stay in her home and take care of her and she was very sick and not wanting to move to a nursing home. The grandmother passed away 2 years ago and since that time the patient has been laid off from work. Do to financial difficulties from his loss of job need to move in with his mother and family. The patient reports that the situation was very chaotic but that he has been able to move out he was on more recently. Currently, he reports he got a good job and is living on his on doing okay financially.   Interventions Strategy:  Cognitive/behavioral psychotherapeutic interventions  Participation Level:   Active  Participation Quality:  Appropriate      Behavioral Observation:  Well Groomed, Alert, and Appropriate.   Current Psychosocial Factors: The patient reports that he developed symptoms consistent with serotonergic syndrome  and the antidepressant medications were stopped. He reports that this is been associated with increased depression and social isolation and they're continuing to look at other medication strategies. The patient reports that from a social standpoint he has been more isolative recently in questioning his long-term social and friendship possibilities  Content of Session:   Review current symptoms and continue to work on therapeutic interventions.  Current Status:   The patient reports that the depression anxiety has been more prevalent over the past month or so. He reports that he developed serotonergic syndrome and may abruptly stop the SSRI medication and are looking at other antidepressants.  Patient Progress:   Stable with active participation by the patient.  Target Goals:   Target goals include reducing the intensity, severity, and duration of symptoms of depression, anxiety and frequency of panic attacks.  Last Reviewed:   10/22/2014  Goals Addressed Today:    Goals addressed today have to do with panic symptoms primarily but also with recurrent symptoms of depression.  Impression/Diagnosis:  The patient has a long history of very poor self-esteem difficult to establish major and significant relationships. The patient reports that his issues of self-esteem leave her vulnerable to try to make quick relationships and confusion around how to establish romantic relationships. The patient has a prior history of panic attacks and this does appear to resurface. He also describes symptoms consistent with recurrent major depression without psychosis as well as generalized anxiety disorder.   Diagnosis:    Axis I: Generalized anxiety disorder  Panic  disorder with agoraphobia

## 2014-11-05 ENCOUNTER — Ambulatory Visit (INDEPENDENT_AMBULATORY_CARE_PROVIDER_SITE_OTHER): Payer: 59 | Admitting: Psychiatry

## 2014-11-05 ENCOUNTER — Encounter (HOSPITAL_COMMUNITY): Payer: Self-pay | Admitting: Psychiatry

## 2014-11-05 VITALS — BP 111/78 | HR 72 | Ht 67.0 in | Wt 158.0 lb

## 2014-11-05 DIAGNOSIS — F411 Generalized anxiety disorder: Secondary | ICD-10-CM

## 2014-11-05 DIAGNOSIS — F322 Major depressive disorder, single episode, severe without psychotic features: Secondary | ICD-10-CM

## 2014-11-05 MED ORDER — FLUOXETINE HCL 20 MG PO CAPS: 20.0000 mg | ORAL_CAPSULE | Freq: Every day | ORAL | Status: DC

## 2014-11-05 NOTE — Patient Instructions (Signed)
Take Ativan 1 mg three times per day

## 2014-11-05 NOTE — Progress Notes (Signed)
Psychiatric Assessment Adult  [name] Date of Evaluation:  11/05/2014 Chief Complaint: I stay depressed and anxious all the time History of Chief Complaint:   Chief Complaint  Patient presents with  . Depression  . Anxiety  . Establish Care    HPI this patient is a 43 year old single white male lives alone in [place]. 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  The patient was referred by his primary physician, Dr. Regino SchultzeMcGough, for further assessment and treatment of depression and anxiety.  The patient states that his depression has been building over the last several years. As a baby his mother gave him up to his maternal grandmother and she raised him off his life. When he turned about 89 years old his grandmother's health declined. Between the ages of [age] and [age] he did nothing but go to work school and take care of his grandmother. He claims he "didn't have a life". His grandmother died when he was [age] and this was very difficult for him. He moved in with his mother her husband and his brother but he can get along with them because little lifestyle there was noisy and chaotic.  About a year ago he moved out on his own. He was hoping things would improve but instead he's gotten increasingly depressed. He was dating a girl for a while and she moved away several months ago and he is been really on the bottom since then. He feels sad all the time, nothing makes him happy, his energy is low he's very anxious and has panic attacks and breakdowns he worries and ruminates about everything constantly He is sleeping and eating well. He denies overt suicidal ideation but feels like it wouldn't matter if his life ended. On the positive side he is very conscious about health and exercise, works out frequently and runs. He does not have any psychotic symptoms. He was drinking a lot until about 3 months ago but is cut it down considerably to 1 or 2 drinks every few weeks. He  does not use drugs.  Dr. Diona BrownerMcDowell tried him on Zoloft, Paxil, then a combination of Paxil and Wellbutrin which caused severe tremors. More recently he's been on lithium but couldn't tolerate it. He was on clonazepam which did not help his anxiety and now he is on Ativan. He doesn't seem to help but he waits until he is having a panic attack to take it Review of Systems  Constitutional: Positive for activity change.  Eyes: Negative.   Respiratory: Negative.   Cardiovascular: Negative.   Gastrointestinal: Negative.   Endocrine: Negative.   Genitourinary: Negative.   Musculoskeletal: Negative.   Allergic/Immunologic: Negative.   Neurological: Negative.   Hematological: Negative.   Psychiatric/Behavioral: Positive for dysphoric mood. The patient is nervous/anxious.    Physical Exam not done  Depressive Symptoms: depressed mood, anhedonia, feelings of worthlessness/guilt, difficulty concentrating, hopelessness, anxiety, panic attacks, loss of energy/fatigue,  (Hypo) Manic Symptoms:   Elevated Mood:  No Irritable Mood:  No Grandiosity:  No Distractibility:  No Labiality of Mood:  No Delusions:  No Hallucinations:  No Impulsivity:  No Sexually Inappropriate Behavior:  No Financial Extravagance:  No Flight of Ideas:  No  Anxiety Symptoms: Excessive Worry:  Yes Panic Symptoms:  Yes Agoraphobia:  No Obsessive Compulsive: No  Symptoms: None, Specific Phobias:  No Social Anxiety:  No  Psychotic Symptoms:  Hallucinations: No None Delusions:  No Paranoia:  No   Ideas of Reference:  No  PTSD Symptoms: Ever had a traumatic exposure:  No Had a traumatic exposure in the last month:  No Re-experiencing: No None Hypervigilance:  No Hyperarousal: No None Avoidance: No None  Traumatic Brain Injury: No   Past Psychiatric History: Diagnosis: Depression, anxiety   Hospitalizations: none  Outpatient Care: Only through primary care   Substance Abuse Care: none   Self-Mutilation: none  Suicidal Attempts: none  Violent Behaviors: none   Past Medical History:   Past Medical History  Diagnosis Date  . Depression     zoloft no help   . Panic attacks   . GAD (generalized anxiety disorder)   . Serotonin syndrome 09/17/2014    on paxil + wellbutrin   History of Loss of Consciousness:  No Seizure History:  No Cardiac History:  No Allergies:  No Known Allergies Current Medications:  Current Outpatient Prescriptions  Medication Sig Dispense Refill  . LORazepam (ATIVAN) 1 MG tablet Take by mouth. Taking 2 Tablets 4 times a day    . FLUoxetine (PROZAC) 20 MG capsule Take 1 capsule (20 mg total) by mouth daily. 30 capsule 2  . lithium carbonate 300 MG capsule Take 300 mg by mouth 3 (three) times daily with meals.     No current facility-administered medications for this visit.    Previous Psychotropic Medications:  Medication Dose   Zoloft, Paxil, Wellbutrin, clonazepam                        Substance Abuse History in the last 12 months: Substance Age of 1st Use Last Use Amount Specific Type  Nicotine      Alcohol    drinks very occasionally    Cannabis      Opiates      Cocaine      Methamphetamines      LSD      Ecstasy      Benzodiazepines      Caffeine      Inhalants      Others:                          Medical Consequences of Substance Abuse: none  Legal Consequences of Substance Abuse: none  Family Consequences of Substance Abuse: none  Blackouts:  No DT's:  No Withdrawal Symptoms:  No None  Social History: Current Place of Residence: [place] [address] of Birth: [place] Family Members: Mother, stepfather, 2 brothers Marital Status:  Single Children:none Relationships: Currently not dating Education: 2 year degree in Environmental manager Problems/Performance: Religious Beliefs/Practices: Unknown History of Abuse: none Electrical engineer  History:  None. Legal History: none Hobbies/Interests: Weightlifting and running  Family History:   Family History  Problem Relation Age of Onset  . Cancer Maternal Grandmother   . Cancer Maternal Grandfather   . Depression Mother   . Drug abuse Brother     Mental Status Examination/Evaluation: Objective:  Appearance: Casual, Neat and Well Groomed  Patent attorney::  Fair  Speech:  Clear and Coherent  Volume:  Normal  Mood:  Depressed and anxious   Affect:  Constricted and Depressed  Thought Process:  Goal Directed  Orientation:  Full (Time, Place, and Person)  Thought Content:  Rumination  Suicidal Thoughts:  Yes.  without intent/plan  Homicidal Thoughts:  No  Judgement:  Fair  Insight:  Fair  Psychomotor Activity:  Normal  Akathisia:  No  Handed:  Right  AIMS (if indicated):    Assets:  Communication Skills Desire for Improvement Physical Health Resilience Talents/Skills Vocational/Educational    Laboratory/X-Ray Psychological Evaluation(s)       Assessment:  Axis I: Major Depression, single episode  AXIS I Generalized Anxiety Disorder and Major Depression, single episode  AXIS II Deferred  AXIS III Past Medical History  Diagnosis Date  . Depression     zoloft no help   . Panic attacks   . GAD (generalized anxiety disorder)   . Serotonin syndrome 09/17/2014    on paxil + wellbutrin     AXIS IV other psychosocial or environmental problems  AXIS V 41-50 serious symptoms   Treatment Plan/Recommendations:  Plan of Care: Medication management   Laboratory:    Psychotherapy: He is seeing Sudie BaileyJohn Rodenbaugh here   Medications: we'll start Prozac 20 mg daily to help with depression and take his Ativan 1 mg 3 times a day on a scheduled basis   Routine PRN Medications:  No  Consultations:   Safety Concerns:  He denies thoughts of harm to self or others   Other:  He'll return in 3 weeks     Diannia RuderOSS, DEBORAH, MD 11/24/20154:46 PM

## 2014-11-15 ENCOUNTER — Other Ambulatory Visit (HOSPITAL_COMMUNITY): Payer: Self-pay | Admitting: Psychiatry

## 2014-11-15 ENCOUNTER — Telehealth (HOSPITAL_COMMUNITY): Payer: Self-pay | Admitting: *Deleted

## 2014-11-15 MED ORDER — LORAZEPAM 1 MG PO TABS: 1.0000 mg | ORAL_TABLET | Freq: Three times a day (TID) | ORAL | Status: DC

## 2014-11-15 NOTE — Telephone Encounter (Signed)
Pt came in to get printed script for his Ativan. Pt agreed with printed script. Pt D/L number is [phone].

## 2014-11-27 ENCOUNTER — Encounter (HOSPITAL_COMMUNITY): Payer: Self-pay | Admitting: Psychiatry

## 2014-11-27 ENCOUNTER — Ambulatory Visit (INDEPENDENT_AMBULATORY_CARE_PROVIDER_SITE_OTHER): Payer: 59 | Admitting: Psychiatry

## 2014-11-27 VITALS — BP 125/75 | HR 63 | Ht 67.0 in | Wt 152.6 lb

## 2014-11-27 DIAGNOSIS — F322 Major depressive disorder, single episode, severe without psychotic features: Secondary | ICD-10-CM

## 2014-11-27 DIAGNOSIS — F329 Major depressive disorder, single episode, unspecified: Secondary | ICD-10-CM

## 2014-11-27 DIAGNOSIS — F411 Generalized anxiety disorder: Secondary | ICD-10-CM

## 2014-11-27 MED ORDER — FLUOXETINE HCL 40 MG PO CAPS: 40.0000 mg | ORAL_CAPSULE | Freq: Every day | ORAL | Status: DC

## 2014-11-27 MED ORDER — LORAZEPAM 1 MG PO TABS: 1.0000 mg | ORAL_TABLET | Freq: Four times a day (QID) | ORAL | Status: DC

## 2014-11-27 NOTE — Progress Notes (Signed)
Patient ID: Allen Conley, male   DOB: Apr 25, 1971, 43 y.o.   MRN: [id number]  Psychiatric Assessment Adult  [name] Date of Evaluation:  11/27/2014 Chief Complaint: "I'm a little better." History of Chief Complaint:   Chief Complaint  Patient presents with  . Depression  . Anxiety  . Follow-up    Anxiety Symptoms include nervous/anxious behavior.     this patient is a 43 year old single white male lives alone in [place]. 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  The patient was referred by his primary physician, Dr. Regino SchultzeMcGough, for further assessment and treatment of depression and anxiety.  The patient states that his depression has been building over the last several years. As a baby his mother gave him up to his maternal grandmother and she raised him off his life. When he turned about 43 years old his grandmother's health declined. Between the ages of [age] and [age] he did nothing but go to work school and take care of his grandmother. He claims he "didn't have a life". His grandmother died when he was [age] and this was very difficult for him. He moved in with his mother her husband and his brother but he can get along with them because little lifestyle there was noisy and chaotic.  About a year ago he moved out on his own. He was hoping things would improve but instead he's gotten increasingly depressed. He was dating a girl for a while and she moved away several months ago and he is been really on the bottom since then. He feels sad all the time, nothing makes him happy, his energy is low he's very anxious and has panic attacks and breakdowns he worries and ruminates about everything constantly He is sleeping and eating well. He denies overt suicidal ideation but feels like it wouldn't matter if his life ended. On the positive side he is very conscious about health and exercise, works out frequently and runs. He does not have any psychotic symptoms. He was  drinking a lot until about 3 months ago but is cut it down considerably to 1 or 2 drinks every few weeks. He does not use drugs.  Dr. Diona BrownerMcDowell tried him on Zoloft, Paxil, then a combination of Paxil and Wellbutrin which caused severe tremors. More recently he's been on lithium but couldn't tolerate it. He was on clonazepam which did not help his anxiety and now he is on Ativan. He doesn't seem to help but he waits until he is having a panic attack to take it  The patient returns after 4 weeks. He is now taking Ativan 1 mg 3 times a day and Prozac 20 mg daily. He states his anxiety and depression are down from a rating of 10 to a rating of 7. His job is making him work 4 10 hour days and he is exhausted. He may need one next to pill of Ativan at a day to help him sleep at night because he gets a wound. He still worries and ruminates a lot. He denies suicidal ideation but still feels depressed. I told him we probably need to increase his Prozac and add one Ativan pill per day Review of Systems  Constitutional: Positive for activity change.  Eyes: Negative.   Respiratory: Negative.   Cardiovascular: Negative.   Gastrointestinal: Negative.   Endocrine: Negative.   Genitourinary: Negative.   Musculoskeletal: Negative.   Allergic/Immunologic: Negative.   Neurological: Negative.   Hematological: Negative.  Psychiatric/Behavioral: Positive for dysphoric mood. The patient is nervous/anxious.    Physical Exam not done  Depressive Symptoms: depressed mood, anhedonia, feelings of worthlessness/guilt, difficulty concentrating, hopelessness, anxiety, panic attacks, loss of energy/fatigue,  (Hypo) Manic Symptoms:   Elevated Mood:  No Irritable Mood:  No Grandiosity:  No Distractibility:  No Labiality of Mood:  No Delusions:  No Hallucinations:  No Impulsivity:  No Sexually Inappropriate Behavior:  No Financial Extravagance:  No Flight of Ideas:  No  Anxiety Symptoms: Excessive Worry:   Yes Panic Symptoms:  Yes Agoraphobia:  No Obsessive Compulsive: No  Symptoms: None, Specific Phobias:  No Social Anxiety:  No  Psychotic Symptoms:  Hallucinations: No None Delusions:  No Paranoia:  No   Ideas of Reference:  No  PTSD Symptoms: Ever had a traumatic exposure:  No Had a traumatic exposure in the last month:  No Re-experiencing: No None Hypervigilance:  No Hyperarousal: No None Avoidance: No None  Traumatic Brain Injury: No   Past Psychiatric History: Diagnosis: Depression, anxiety   Hospitalizations: none  Outpatient Care: Only through primary care   Substance Abuse Care: none  Self-Mutilation: none  Suicidal Attempts: none  Violent Behaviors: none   Past Medical History:   Past Medical History  Diagnosis Date  . Depression     zoloft no help   . Panic attacks   . GAD (generalized anxiety disorder)   . Serotonin syndrome 09/17/2014    on paxil + wellbutrin   History of Loss of Consciousness:  No Seizure History:  No Cardiac History:  No Allergies:  No Known Allergies Current Medications:  Current Outpatient Prescriptions  Medication Sig Dispense Refill  . LORazepam (ATIVAN) 1 MG tablet Take 1 tablet (1 mg total) by mouth 4 (four) times daily. 120 tablet 2  . FLUoxetine (PROZAC) 40 MG capsule Take 1 capsule (40 mg total) by mouth daily. 30 capsule 2   No current facility-administered medications for this visit.    Previous Psychotropic Medications:  Medication Dose   Zoloft, Paxil, Wellbutrin, clonazepam                        Substance Abuse History in the last 12 months: Substance Age of 1st Use Last Use Amount Specific Type  Nicotine      Alcohol    drinks very occasionally    Cannabis      Opiates      Cocaine      Methamphetamines      LSD      Ecstasy      Benzodiazepines      Caffeine      Inhalants      Others:                          Medical Consequences of Substance Abuse: none  Legal Consequences of  Substance Abuse: none  Family Consequences of Substance Abuse: none  Blackouts:  No DT's:  No Withdrawal Symptoms:  No None  Social History: Current [place] of Residence: [place] [address] [place] Place of Birth: [place] Family Members: Mother, stepfather, 2 brothers Marital Status:  Single Children:none Relationships: Currently not dating Education: 2 year degree in Environmental managerinformation technology Educational Problems/Performance: Religious Beliefs/Practices: Unknown History of Abuse: none Electrical engineerccupational Experiences inventory clerk Military History:  None. Legal History: none Hobbies/Interests: Weightlifting and running  Family History:   Family History  Problem Relation Age of Onset  . Cancer Maternal  Grandmother   . Cancer Maternal Grandfather   . Depression Mother   . Drug abuse Brother     Mental Status Examination/Evaluation: Objective:  Appearance: Casual, Neat and Well Groomed  Eye Contact::  Fair  Speech:  Clear and Coherent  Volume:  Normal  Mood:  Anxious, appears less depressed   Affect: A bit constricted   Thought Process:  Goal Directed  Orientation:  Full (Time, Place, and Person)  Thought Content:  Rumination  Suicidal Thoughts:  no  Homicidal Thoughts:  No  Judgement:  Fair  Insight:  Fair  Psychomotor Activity:  Normal  Akathisia:  No  Handed:  Right  AIMS (if indicated):    Assets:  Communication Skills Desire for Improvement Physical Health Resilience Talents/Skills Vocational/Educational    Laboratory/X-Ray Psychological Evaluation(s)       Assessment:  Axis I: Major Depression, single episode  AXIS I Generalized Anxiety Disorder and Major Depression, single episode  AXIS II Deferred  AXIS III Past Medical History  Diagnosis Date  . Depression     zoloft no help   . Panic attacks   . GAD (generalized anxiety disorder)   . Serotonin syndrome 09/17/2014    on paxil + wellbutrin     AXIS IV other psychosocial or environmental  problems  AXIS V 41-50 serious symptoms   Treatment Plan/Recommendations:  Plan of Care: Medication management   Laboratory:    Psychotherapy: He is seeing Sudie Bailey here   Medications: He will increase Prozac to 40 mg daily to help with depression and take his Ativan 1 mg 4 times a day on a scheduled basis   Routine PRN Medications:  No  Consultations:   Safety Concerns:  He denies thoughts of harm to self or others   Other:  He'll return in 3 weeks     Diannia Ruder, MD 12/16/20152:04 PM

## 2014-12-04 ENCOUNTER — Ambulatory Visit (INDEPENDENT_AMBULATORY_CARE_PROVIDER_SITE_OTHER): Payer: 59 | Admitting: Psychology

## 2014-12-04 DIAGNOSIS — F411 Generalized anxiety disorder: Secondary | ICD-10-CM | POA: Diagnosis not present

## 2014-12-04 DIAGNOSIS — F322 Major depressive disorder, single episode, severe without psychotic features: Secondary | ICD-10-CM | POA: Diagnosis not present

## 2014-12-25 ENCOUNTER — Ambulatory Visit (INDEPENDENT_AMBULATORY_CARE_PROVIDER_SITE_OTHER): Payer: 59 | Admitting: Nurse Practitioner

## 2014-12-25 ENCOUNTER — Encounter: Payer: Self-pay | Admitting: *Deleted

## 2014-12-25 ENCOUNTER — Encounter: Payer: Self-pay | Admitting: Nurse Practitioner

## 2014-12-25 VITALS — BP 122/75 | HR 66 | Temp 98.0°F | Ht 67.0 in | Wt 151.0 lb

## 2014-12-25 DIAGNOSIS — A084 Viral intestinal infection, unspecified: Secondary | ICD-10-CM

## 2014-12-25 NOTE — Progress Notes (Signed)
Pre visit review using our clinic review tool, if applicable. No additional management support is needed unless otherwise documented below in the visit note. 

## 2014-12-25 NOTE — Patient Instructions (Signed)
Stay hydrated by sipping small amounts fluid frequently. Popsicles, jello will help you stay hydrated. If you develop diarrhea, avoid apple juice and immodium. Apple sauce is OK & can help bind stool. You should be feeling better in a few days. Let us know if you have concerns.  Viral Gastroenteritis Viral gastroenteritis is also known as stomach flu. This condition affects the stomach and intestinal tract. It can cause sudden diarrhea and vomiting. The illness typically lasts 3 to 8 days. Most people develop an immune response that eventually gets rid of the virus. While this natural response develops, the virus can make you quite ill. CAUSES  Many different viruses can cause gastroenteritis, such as rotavirus or noroviruses. You can catch one of these viruses by consuming contaminated food or water. You may also catch a virus by sharing utensils or other personal items with an infected person or by touching a contaminated surface. SYMPTOMS  The most common symptoms are diarrhea and vomiting. These problems can cause a severe loss of body fluids (dehydration) and a body salt (electrolyte) imbalance. Other symptoms may include:  Fever.  Headache.  Fatigue.  Abdominal pain. DIAGNOSIS  Your caregiver can usually diagnose viral gastroenteritis based on your symptoms and a physical exam. A stool sample may also be taken to test for the presence of viruses or other infections. TREATMENT  This illness typically goes away on its own. Treatments are aimed at rehydration. The most serious cases of viral gastroenteritis involve vomiting so severely that you are not able to keep fluids down. In these cases, fluids must be given through an intravenous line (IV). HOME CARE INSTRUCTIONS   Drink enough fluids to keep your urine clear or pale yellow. Drink small amounts of fluids frequently and increase the amounts as tolerated.  Ask your caregiver for specific rehydration instructions.  Avoid:  Foods  high in sugar.  Alcohol.  Carbonated drinks.  Tobacco.  Juice.  Caffeine drinks.  Extremely hot or cold fluids.  Fatty, greasy foods.  Too much intake of anything at one time.  Dairy products until 24 to 48 hours after diarrhea stops.  You may consume probiotics. Probiotics are active cultures of beneficial bacteria. They may lessen the amount and number of diarrheal stools in adults. Probiotics can be found in yogurt with active cultures and in supplements.  Wash your hands well to avoid spreading the virus.  Only take over-the-counter or prescription medicines for pain, discomfort, or fever as directed by your caregiver. Do not give aspirin to children. Antidiarrheal medicines are not recommended.  Ask your caregiver if you should continue to take your regular prescribed and over-the-counter medicines.  Keep all follow-up appointments as directed by your caregiver. SEEK IMMEDIATE MEDICAL CARE IF:   You are unable to keep fluids down.  You do not urinate at least once every 6 to 8 hours.  You develop shortness of breath.  You notice blood in your stool or vomit. This may look like coffee grounds.  You have abdominal pain that increases or is concentrated in one small area (localized).  You have persistent vomiting or diarrhea.  You have a fever.  The patient is a child younger than 3 months, and he or she has a fever.  The patient is a child older than 3 months, and he or she has a fever and persistent symptoms.  The patient is a child older than 3 months, and he or she has a fever and symptoms suddenly get worse.  The  patient is a baby, and he or she has no tears when crying. MAKE SURE YOU:   Understand these instructions.  Will watch your condition.  Will get help right away if you are not doing well or get worse. Document Released: 11/29/2005 Document Revised: 02/21/2012 Document Reviewed: 09/15/2011 [facility] Patient Information 2015 [employer] [place].  This information is not intended to replace advice given to you by your health care provider. Make sure you discuss any questions you have with your health care provider.

## 2014-12-25 NOTE — Progress Notes (Signed)
   Subjective:    Patient ID: Allen Conley, male    DOB: 24-Feb-1971, 44 y.o.   MRN: [id number]  Emesis  This is a new problem. The current episode started yesterday. The problem occurs less than 2 times per day (yesterday, not today). The problem has been gradually improving. The emesis has an appearance of stomach contents. There has been no fever. Associated symptoms include chills, headaches and myalgias. Pertinent negatives include no abdominal pain, chest pain, coughing, diarrhea, dizziness, fever, sweats or URI. Risk factors include ill contacts. He has tried nothing for the symptoms.      Review of Systems  Constitutional: Positive for chills and fatigue. Negative for fever.  Respiratory: Negative for cough.   Cardiovascular: Negative for chest pain.  Gastrointestinal: Positive for vomiting. Negative for nausea (better today), abdominal pain and diarrhea.  Musculoskeletal: Positive for myalgias.  Neurological: Positive for headaches. Negative for dizziness.       Objective:   Physical Exam  Constitutional: He is oriented to person, place, and time. He appears well-developed and well-nourished. No distress.  HENT:  Head: Normocephalic and atraumatic.  Right Ear: External ear normal.  Left Ear: External ear normal.  Mouth/Throat: Oropharynx is clear and moist. No oropharyngeal exudate.  Eyes: Conjunctivae are normal. Right eye exhibits no discharge. Left eye exhibits no discharge.  Cardiovascular: Normal rate, regular rhythm and normal heart sounds.   No murmur heard. Pulmonary/Chest: Effort normal and breath sounds normal. No respiratory distress. He has no wheezes. He has no rales.  Abdominal: Soft. Bowel sounds are normal. He exhibits no distension and no mass. There is tenderness (periumbilical). There is no rebound and no guarding.  Neurological: He is alert and oriented to person, place, and time.  Skin: Skin is warm and dry.  Psychiatric: He has a normal mood and  affect. His behavior is normal. Thought content normal.  Vitals reviewed.         Assessment & Plan:  1. Viral gastroenteritis Supportive care Anticipatory guidance See pt instructions F/u PRN

## 2015-01-02 ENCOUNTER — Telehealth (HOSPITAL_COMMUNITY): Payer: Self-pay | Admitting: *Deleted

## 2015-01-02 NOTE — Telephone Encounter (Signed)
lmtcb number to resch appt due to office being closed tomorrow 01-03-15 due to weather. number provided 

## 2015-01-03 ENCOUNTER — Ambulatory Visit (HOSPITAL_COMMUNITY): Payer: 59 | Admitting: Psychiatry

## 2015-01-21 ENCOUNTER — Ambulatory Visit: Payer: 59 | Admitting: Nurse Practitioner

## 2015-01-27 ENCOUNTER — Ambulatory Visit (HOSPITAL_COMMUNITY): Payer: 59 | Admitting: Psychiatry

## 2015-01-28 ENCOUNTER — Telehealth (HOSPITAL_COMMUNITY): Payer: Self-pay | Admitting: *Deleted

## 2015-01-28 NOTE — Telephone Encounter (Signed)
lmtcb due to wanting to resch appt for appt that was scheduled for 01-27-15......../or

## 2015-01-31 ENCOUNTER — Ambulatory Visit (HOSPITAL_COMMUNITY): Payer: 59 | Admitting: Psychiatry

## 2015-02-03 ENCOUNTER — Telehealth (HOSPITAL_COMMUNITY): Payer: Self-pay | Admitting: *Deleted

## 2015-02-03 ENCOUNTER — Other Ambulatory Visit (HOSPITAL_COMMUNITY): Payer: Self-pay | Admitting: Psychiatry

## 2015-02-03 MED ORDER — FLUOXETINE HCL 40 MG PO CAPS: 40.0000 mg | ORAL_CAPSULE | Freq: Every day | ORAL | Status: DC

## 2015-02-03 NOTE — Telephone Encounter (Signed)
Resent

## 2015-02-03 NOTE — Telephone Encounter (Signed)
Patient called out of town for work.   He needs Prozac refill.    He uses [facility].

## 2015-02-04 ENCOUNTER — Ambulatory Visit (HOSPITAL_COMMUNITY): Payer: 59 | Admitting: Psychology

## 2015-02-05 ENCOUNTER — Ambulatory Visit (HOSPITAL_COMMUNITY): Payer: 59 | Admitting: Psychiatry

## 2015-03-10 ENCOUNTER — Telehealth (HOSPITAL_COMMUNITY): Payer: Self-pay | Admitting: *Deleted

## 2015-03-10 NOTE — Telephone Encounter (Signed)
Pt called requesting refills for his Ativan. Pt medication was last filled 11/2015. Pt was last seen 11-27-2014.  Pt made f/u appt for 03-21-15. Pt is wondering if he would have to wait until his f/u appt to get his medication? Pt number is [phone]

## 2015-03-11 ENCOUNTER — Telehealth (HOSPITAL_COMMUNITY): Payer: Self-pay | Admitting: *Deleted

## 2015-03-11 MED ORDER — LORAZEPAM 1 MG PO TABS: 1.0000 mg | ORAL_TABLET | Freq: Four times a day (QID) | ORAL | Status: DC

## 2015-03-11 NOTE — Telephone Encounter (Signed)
lmtcb

## 2015-03-11 NOTE — Telephone Encounter (Signed)
Per Dr. Tenny Crawoss to call in enough tablets for pt Ativan to last him until his next appt. Called pharmacy and spoke with Homero FellersFrank B. The pharmacist and he stated that they will get pt medications ready for him. Left on pt voicemail to call office back. Number provided

## 2015-03-11 NOTE — Telephone Encounter (Signed)
You may call in enough to last until appointment 

## 2015-03-11 NOTE — Telephone Encounter (Signed)
Called pt informed him that his medications was sent to pharmacy. Pt showed understanding

## 2015-03-19 ENCOUNTER — Encounter (HOSPITAL_COMMUNITY): Payer: Self-pay | Admitting: Psychiatry

## 2015-03-19 ENCOUNTER — Ambulatory Visit (INDEPENDENT_AMBULATORY_CARE_PROVIDER_SITE_OTHER): Payer: 59 | Admitting: Psychiatry

## 2015-03-19 VITALS — BP 111/76 | HR 82 | Ht 67.0 in | Wt 152.0 lb

## 2015-03-19 DIAGNOSIS — F411 Generalized anxiety disorder: Secondary | ICD-10-CM

## 2015-03-19 DIAGNOSIS — F329 Major depressive disorder, single episode, unspecified: Secondary | ICD-10-CM

## 2015-03-19 DIAGNOSIS — F322 Major depressive disorder, single episode, severe without psychotic features: Secondary | ICD-10-CM

## 2015-03-19 MED ORDER — LORAZEPAM 1 MG PO TABS: 1.0000 mg | ORAL_TABLET | Freq: Four times a day (QID) | ORAL | Status: DC

## 2015-03-19 MED ORDER — VILAZODONE HCL 40 MG PO TABS: 40.0000 mg | ORAL_TABLET | Freq: Every day | ORAL | Status: DC

## 2015-03-19 NOTE — Progress Notes (Signed)
Patient ID: Allen Conley, male   DOB: Mar 12, 1971, 44 y.o.   MRN: [id number] Patient ID: Allen Conley, male   DOB: Mar 12, 1971, 44 y.o.   MRN: [id number]  Psychiatric Assessment Adult  [name] Date of Evaluation:  03/19/2015 Chief Complaint: "I have a new job History of Chief Complaint:   Chief Complaint  Patient presents with  . Depression  . Anxiety  . Follow-up    Anxiety Symptoms include nervous/anxious behavior.     this patient is a 44 year old single white male lives alone in [place]. He works for [employer].  The patient was referred by his primary physician, Dr. Regino SchultzeMcGough, for further assessment and treatment of depression and anxiety.  The patient states that his depression has been building over the last several years. As a baby his mother gave him up to his maternal grandmother and she raised him off his life. When he turned about 44 years old his grandmother's health declined. Between the ages of [age] and [age] he did nothing but go to work school and take care of his grandmother. He claims he "didn't have a life". His grandmother died when he was [age] and this was very difficult for him. He moved in with his mother her husband and his brother but he can get along with them because little lifestyle there was noisy and chaotic.  About a year ago he moved out on his own. He was hoping things would improve but instead he's gotten increasingly depressed. He was dating a girl for a while and she moved away several months ago and he is been really on the bottom since then. He feels sad all the time, nothing makes him happy, his energy is low he's very anxious and has panic attacks and breakdowns he worries and ruminates about everything constantly He is sleeping and eating well. He denies overt suicidal ideation but feels like it wouldn't matter if his life ended. On the positive side he is very conscious about health and exercise, works out frequently and runs. He  does not have any psychotic symptoms. He was drinking a lot until about 3 months ago but is cut it down considerably to 1 or 2 drinks every few weeks. He does not use drugs.  Dr. Diona BrownerMcDowell tried him on Zoloft, Paxil, then a combination of Paxil and Wellbutrin which caused severe tremors. More recently he's been on lithium but couldn't tolerate it. He was on clonazepam which did not help his anxiety and now he is on Ativan. He doesn't seem to help but he waits until he is having a panic attack to take it  The patient returns after 3 months. 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 He has had to travel to [place] for trainings. His mood is pretty good while he is at work but once he gets by himself he feels lonely and depressed. The Prozac is helped to some degree but he still has periods of feeling very low. He is also tried Wellbutrin Paxil and Zoloft in the recent past. I suggested we switch to Viibryd since it is a newer drug that he has not tried. The Ativan seems to be working for his anxiety and he takes it on a scheduled basis. He denies suicidal ideation and he is still active working out in the gym Review of Systems  Constitutional: Positive for activity change.  Eyes: Negative.  Respiratory: Negative.   Cardiovascular: Negative.   Gastrointestinal: Negative.   Endocrine: Negative.   Genitourinary: Negative.   Musculoskeletal: Negative.   Allergic/Immunologic: Negative.   Neurological: Negative.   Hematological: Negative.   Psychiatric/Behavioral: Positive for dysphoric mood. The patient is nervous/anxious.    Physical Exam not done  Depressive Symptoms: depressed mood, anhedonia, feelings of worthlessness/guilt, difficulty concentrating, hopelessness, anxiety, panic attacks, loss of energy/fatigue,  (Hypo) Manic Symptoms:   Elevated Mood:  No Irritable Mood:  No Grandiosity:  No Distractibility:   No Labiality of Mood:  No Delusions:  No Hallucinations:  No Impulsivity:  No Sexually Inappropriate Behavior:  No Financial Extravagance:  No Flight of Ideas:  No  Anxiety Symptoms: Excessive Worry:  Yes Panic Symptoms:  Yes Agoraphobia:  No Obsessive Compulsive: No  Symptoms: None, Specific Phobias:  No Social Anxiety:  No  Psychotic Symptoms:  Hallucinations: No None Delusions:  No Paranoia:  No   Ideas of Reference:  No  PTSD Symptoms: Ever had a traumatic exposure:  No Had a traumatic exposure in the last month:  No Re-experiencing: No None Hypervigilance:  No Hyperarousal: No None Avoidance: No None  Traumatic Brain Injury: No   Past Psychiatric History: Diagnosis: Depression, anxiety   Hospitalizations: none  Outpatient Care: Only through primary care   Substance Abuse Care: none  Self-Mutilation: none  Suicidal Attempts: none  Violent Behaviors: none   Past Medical History:   Past Medical History  Diagnosis Date  . Depression     zoloft no help   . Panic attacks   . GAD (generalized anxiety disorder)   . Serotonin syndrome 09/17/2014    on paxil + wellbutrin   History of Loss of Consciousness:  No Seizure History:  No Cardiac History:  No Allergies:  No Known Allergies Current Medications:  Current Outpatient Prescriptions  Medication Sig Dispense Refill  . LORazepam (ATIVAN) 1 MG tablet Take 1 tablet (1 mg total) by mouth 4 (four) times daily. 120 tablet 2  . Vilazodone HCl (VIIBRYD) 40 MG TABS Take 1 tablet (40 mg total) by mouth daily. 30 tablet 2   No current facility-administered medications for this visit.    Previous Psychotropic Medications:  Medication Dose   Zoloft, Paxil, Wellbutrin, clonazepam                        Substance Abuse History in the last 12 months: Substance Age of 1st Use Last Use Amount Specific Type  Nicotine      Alcohol    drinks very occasionally    Cannabis      Opiates      Cocaine       Methamphetamines      LSD      Ecstasy      Benzodiazepines      Caffeine      Inhalants      Others:                          Medical Consequences of Substance Abuse: none  Legal Consequences of Substance Abuse: none  Family Consequences of Substance Abuse: none  Blackouts:  No DT's:  No Withdrawal Symptoms:  No None  Social History: Current Place of Residence: [place] [address] of Birth: [place] Family Members: Mother, stepfather, 2 brothers Marital Status:  Single Children:none Relationships: Currently not dating 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 Legal History: none Hobbies/Interests: Weightlifting and running  Family History:   Family History  Problem Relation Age of Onset  . Cancer Maternal Grandmother   . Cancer Maternal Grandfather   . Depression Mother   . Drug abuse Brother     Mental Status Examination/Evaluation: Objective:  Appearance: Casual, Neat and Well Groomed  Patent attorney::  Fair  Speech:  Clear and Coherent  Volume:  Normal  Mood:  Anxious  Affect: A bit constricted   Thought Process:  Goal Directed  Orientation:  Full (Time, Place, and Person)  Thought Content:  Rumination  Suicidal Thoughts:  no  Homicidal Thoughts:  No  Judgement:  Fair  Insight:  Fair  Psychomotor Activity:  Normal  Akathisia:  No  Handed:  Right  AIMS (if indicated):    Assets:  Communication Skills Desire for Improvement Physical Health Resilience Talents/Skills Vocational/Educational    Laboratory/X-Ray Psychological Evaluation(s)       Assessment:  Axis I: Major Depression, single episode  AXIS I Generalized Anxiety Disorder and Major Depression, single episode  AXIS II Deferred  AXIS III Past Medical History  Diagnosis Date  . Depression      zoloft no help   . Panic attacks   . GAD (generalized anxiety disorder)   . Serotonin syndrome 09/17/2014    on paxil + wellbutrin     AXIS IV other psychosocial or environmental problems  AXIS V 41-50 serious symptoms   Treatment Plan/Recommendations:  Plan of Care: Medication management   Laboratory:    Psychotherapy: He is seeing Sudie Bailey here   Medications: He will discontinue Prozac and start Viibryd dose pack and ending and 40 mg and take his Ativan 1 mg 4 times a day on a scheduled basis   Routine PRN Medications:  No  Consultations:   Safety Concerns:  He denies thoughts of harm to self or others   Other:  He'll return in 4 weeks     Diannia Ruder, MD 4/6/20162:50 PM

## 2015-03-21 ENCOUNTER — Ambulatory Visit (HOSPITAL_COMMUNITY): Payer: 59 | Admitting: Psychiatry

## 2015-04-04 ENCOUNTER — Encounter (HOSPITAL_COMMUNITY): Payer: Self-pay | Admitting: Psychology

## 2015-04-04 NOTE — Progress Notes (Signed)
PROGRESS NOTE     Patient:  Allen Conley   DOB: 16-Aug-1971  MR Number: [id number]  Location: [facility] [address] [place] [id number] Dept: [phone]  Start: 4 PM End: 5 PM  Provider/Observer:     Hershal CoriaJohn R Rodenbough PSYD  Chief Complaint:      Chief Complaint  Patient presents with  . Anxiety  . Depression    Reason For Service:    The patient was referred for psychotherapeutic interventions for depression and anxiety. He is a 44 year old Caucasian male who has been dealing with anxiety significant 5 or more years. The patient describes depression and anxiety as well as racing thoughts and feelings of hopelessness. The patient reports were gradually to catch the people too quickly and these relationships fall apart. He also describes symptoms consistent with panic attacks in the past and it resurface. The patient reports that he always lived with his grandmother and then when the patient was 44 years old his grandmother started getting sick. The patient to stay in her home and take care of her and she was very sick and not wanting to move to a nursing home. The grandmother passed away 2 years ago and since that time the patient has been laid off from work. Do to financial difficulties from his loss of job need to move in with his mother and family. The patient reports that the situation was very chaotic but that he has been able to move out he was on more recently. Currently, he reports he got a good job and is living on his on doing okay financially.   Interventions Strategy:  Cognitive/behavioral psychotherapeutic interventions  Participation Level:   Active  Participation Quality:  Appropriate      Behavioral Observation:  Well Groomed, Alert, and Appropriate.   Current Psychosocial Factors: The patient reports that he developed symptoms consistent with serotonergic syndrome  and the antidepressant medications were stopped. He reports that this is been associated with increased depression and social isolation and they're continuing to look at other medication strategies. The patient reports that from a social standpoint he has been more isolative recently in questioning his long-term social and friendship possibilities  Content of Session:   Review current symptoms and continue to work on therapeutic interventions.  Current Status:   The patient reports that the depression anxiety has been more prevalent over the past month or so. He reports that he developed serotonergic syndrome and may abruptly stop the SSRI medication and are looking at other antidepressants.  Patient Progress:   Stable with active participation by the patient.  Target Goals:   Target goals include reducing the intensity, severity, and duration of symptoms of depression, anxiety and frequency of panic attacks.  Last Reviewed:   12/04/2014  Goals Addressed Today:    Goals addressed today have to do with panic symptoms primarily but also with recurrent symptoms of depression.  Impression/Diagnosis:  The patient has a long history of very poor self-esteem difficult to establish major and significant relationships. The patient reports that his issues of self-esteem leave her vulnerable to try to make quick relationships and confusion around how to establish romantic relationships. The patient has a prior history of panic attacks and this does appear to resurface. He also describes symptoms consistent with recurrent major depression without psychosis as well as generalized anxiety disorder.   Diagnosis:    Axis I: Major depressive disorder, single episode,  severe without psychotic features  Generalized anxiety disorder

## 2015-04-15 ENCOUNTER — Ambulatory Visit (INDEPENDENT_AMBULATORY_CARE_PROVIDER_SITE_OTHER): Payer: 59 | Admitting: Psychology

## 2015-04-15 DIAGNOSIS — F322 Major depressive disorder, single episode, severe without psychotic features: Secondary | ICD-10-CM | POA: Diagnosis not present

## 2015-04-15 DIAGNOSIS — F411 Generalized anxiety disorder: Secondary | ICD-10-CM

## 2015-04-15 DIAGNOSIS — F4001 Agoraphobia with panic disorder: Secondary | ICD-10-CM | POA: Diagnosis not present

## 2015-04-17 ENCOUNTER — Ambulatory Visit (HOSPITAL_COMMUNITY): Payer: Self-pay | Admitting: Psychiatry

## 2015-04-30 ENCOUNTER — Telehealth (HOSPITAL_COMMUNITY): Payer: Self-pay | Admitting: *Deleted

## 2015-04-30 NOTE — Telephone Encounter (Signed)
lmtcb due to provider being out of office 05-26-15. number provided 

## 2015-05-01 ENCOUNTER — Telehealth (HOSPITAL_COMMUNITY): Payer: Self-pay | Admitting: *Deleted

## 2015-05-01 NOTE — Telephone Encounter (Signed)
Called pt and informed him that according to his chart, Dr. Tenny Crawoss filled his medication on 03-19-15 with 2 refills. Per pt he did not know and apologized and stated that he will call his pharmacy.

## 2015-05-01 NOTE — Telephone Encounter (Signed)
PATIENT HAS 2 DAYS OF THE VYRRID. HE USES [facility].

## 2015-05-08 ENCOUNTER — Telehealth (HOSPITAL_COMMUNITY): Payer: Self-pay | Admitting: *Deleted

## 2015-05-08 NOTE — Telephone Encounter (Signed)
Prior authorization for Viibryd 40mg  received. Started online with cover my meds and received message that prior authorization was not required. Called pharmacy to confirm that it was submitted correctly. Was told that it was covered 100% and [facility]. No further action required.

## 2015-05-26 ENCOUNTER — Ambulatory Visit (HOSPITAL_COMMUNITY): Payer: Self-pay | Admitting: Psychiatry

## 2015-05-30 ENCOUNTER — Ambulatory Visit (INDEPENDENT_AMBULATORY_CARE_PROVIDER_SITE_OTHER): Payer: 59 | Admitting: Psychiatry

## 2015-05-30 ENCOUNTER — Encounter (HOSPITAL_COMMUNITY): Payer: Self-pay | Admitting: Psychiatry

## 2015-05-30 VITALS — BP 132/80 | HR 85 | Ht 67.0 in | Wt 169.8 lb

## 2015-05-30 DIAGNOSIS — F329 Major depressive disorder, single episode, unspecified: Secondary | ICD-10-CM

## 2015-05-30 DIAGNOSIS — F411 Generalized anxiety disorder: Secondary | ICD-10-CM

## 2015-05-30 DIAGNOSIS — F322 Major depressive disorder, single episode, severe without psychotic features: Secondary | ICD-10-CM

## 2015-05-30 MED ORDER — VILAZODONE HCL 40 MG PO TABS: 40.0000 mg | ORAL_TABLET | Freq: Every day | ORAL | Status: DC

## 2015-05-30 MED ORDER — ALPRAZOLAM 1 MG PO TABS: 1.0000 mg | ORAL_TABLET | Freq: Four times a day (QID) | ORAL | Status: DC

## 2015-05-30 NOTE — Progress Notes (Signed)
Patient ID: Allen Conley. Allen Conley, male   DOB: Feb 23, 1971, 44 y.o.   MRN: [id number] Patient ID: Allen Conley. Allen Conley, male   DOB: January 06, 1971, 44 y.o.   MRN: [id number] Patient ID: Allen Conley. Allen Conley, male   DOB: 10-05-1971, 44 y.o.   MRN: [id number]  Psychiatric Assessment Adult  [name]. Tinch Date of Evaluation:  05/30/2015 Chief Complaint: "I have a new job History of Chief Complaint:   Chief Complaint  Patient presents with  . Depression  . Anxiety  . Follow-up    Anxiety Symptoms include nervous/anxious behavior.     this patient is a 44 year old single white male lives alone in [place]. He works for [employer].  The patient was referred by his primary physician, Dr. Regino Schultze, for further assessment and treatment of depression and anxiety.  The patient states that his depression has been building over the last several years. As a baby his mother gave him up to his maternal grandmother and she raised him off his life. When he turned about 89 years old his grandmother's health declined. Between the ages of 39 and 79 he did nothing but go to work school and take care of his grandmother. He claims he "didn't have a life". His grandmother died when he was 75 and this was very difficult for him. He moved in with his mother her husband and his brother but he can get along with them because little lifestyle there was noisy and chaotic.  About a year ago he moved out on his own. He was hoping things would improve but instead he's gotten increasingly depressed. He was dating a girl for a while and she moved away several months ago and he is been really on the bottom since then. He feels sad all the time, nothing makes him happy, his energy is low he's very anxious and has panic attacks and breakdowns he worries and ruminates about everything constantly He is sleeping and eating well. He denies overt suicidal ideation but feels like it wouldn't matter if his life ended. On the positive side he is  very conscious about health and exercise, works out frequently and runs. He does not have any psychotic symptoms. He was drinking a lot until about 3 months ago but is cut it down considerably to 1 or 2 drinks every few weeks. He does not use drugs.  Dr. Diona Browner tried him on Zoloft, Paxil, then a combination of Paxil and Wellbutrin which caused severe tremors. More recently he's been on lithium but couldn't tolerate it. He was on clonazepam which did not help his anxiety and now he is on Ativan. He doesn't seem to help but he waits until he is having a panic attack to take it  The patient returns after 2 months. 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 He does think the Viibryd is helped him to some degree. However he works 12 hour night shifts and it's hard for him to sleep a lot during the day he feels exhausted. He is making a lot more money at the job and he doesn't want to give it up. He has a lot of anxiety before he goes in to work in the Ativan is no longer helping. As told him we could switch to Xanax. He is still working out at [employer]  Review of Systems  Constitutional: Positive for activity change.  Eyes: Negative.   Respiratory: Negative.   Cardiovascular: Negative.   Gastrointestinal: Negative.  Endocrine: Negative.   Genitourinary: Negative.   Musculoskeletal: Negative.   Allergic/Immunologic: Negative.   Neurological: Negative.   Hematological: Negative.   Psychiatric/Behavioral: Positive for dysphoric mood. The patient is nervous/anxious.    Physical Exam not done  Depressive Symptoms: depressed mood, anhedonia, feelings of worthlessness/guilt, difficulty concentrating, hopelessness, anxiety, panic attacks, loss of energy/fatigue,  (Hypo) Manic Symptoms:   Elevated Mood:  No Irritable Mood:  No Grandiosity:  No Distractibility:  No Labiality of Mood:  No Delusions:  No Hallucinations:  No Impulsivity:  No Sexually  Inappropriate Behavior:  No Financial Extravagance:  No Flight of Ideas:  No  Anxiety Symptoms: Excessive Worry:  Yes Panic Symptoms:  Yes Agoraphobia:  No Obsessive Compulsive: No  Symptoms: None, Specific Phobias:  No Social Anxiety:  No  Psychotic Symptoms:  Hallucinations: No None Delusions:  No Paranoia:  No   Ideas of Reference:  No  PTSD Symptoms: Ever had a traumatic exposure:  No Had a traumatic exposure in the last month:  No Re-experiencing: No None Hypervigilance:  No Hyperarousal: No None Avoidance: No None  Traumatic Brain Injury: No   Past Psychiatric History: Diagnosis: Depression, anxiety   Hospitalizations: none  Outpatient Care: Only through primary care   Substance Abuse Care: none  Self-Mutilation: none  Suicidal Attempts: none  Violent Behaviors: none   Past Medical History:   Past Medical History  Diagnosis Date  . Depression     zoloft no help   . Panic attacks   . GAD (generalized anxiety disorder)   . Serotonin syndrome 09/17/2014    on paxil + wellbutrin   History of Loss of Consciousness:  No Seizure History:  No Cardiac History:  No Allergies:  No Known Allergies Current Medications:  Current Outpatient Prescriptions  Medication Sig Dispense Refill  . Vilazodone HCl (VIIBRYD) 40 MG TABS Take 1 tablet (40 mg total) by mouth daily. 30 tablet 2  . ALPRAZolam (XANAX) 1 MG tablet Take 1 tablet (1 mg total) by mouth 4 (four) times daily. 120 tablet 2   No current facility-administered medications for this visit.    Previous Psychotropic Medications:  Medication Dose   Zoloft, Paxil, Wellbutrin, clonazepam                        Substance Abuse History in the last 12 months: Substance Age of 1st Use Last Use Amount Specific Type  Nicotine      Alcohol    drinks very occasionally    Cannabis      Opiates      Cocaine      Methamphetamines      LSD      Ecstasy      Benzodiazepines      Caffeine      Inhalants       Others:                          Medical Consequences of Substance Abuse: none  Legal Consequences of Substance Abuse: none  Family Consequences of Substance Abuse: none  Blackouts:  No DT's:  No Withdrawal Symptoms:  No None  Social History: Current Place of Residence: [place] [address] of Birth: [place] Family Members: Mother, stepfather, 2 brothers Marital Status:  Single Children:none Relationships: Currently not dating 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 Legal History: none Hobbies/Interests: Weightlifting and running  Family History:   Family History  Problem Relation Age of Onset  . Cancer Maternal Grandmother   . Cancer Maternal Grandfather   . Depression Mother   . Drug abuse Brother     Mental Status Examination/Evaluation: Objective:  Appearance: Casual, Neat and Well Groomed  Patent attorney::  Fair  Speech:  Clear and Coherent  Volume:  Normal  Mood:  Anxious  Affect: fairly bright, tired   Thought Process:  Goal Directed  Orientation:  Full (Time, Place, and Person)  Thought Content:  Rumination  Suicidal Thoughts:  no  Homicidal Thoughts:  No  Judgement:  Fair  Insight:  Fair  Psychomotor Activity:  Normal  Akathisia:  No  Handed:  Right  AIMS (if indicated):    Assets:  Communication Skills Desire for Improvement Physical Health Resilience Talents/Skills Vocational/Educational    Laboratory/X-Ray Psychological Evaluation(s)       Assessment:  Axis I: Major Depression, single episode  AXIS I Generalized Anxiety Disorder and Major Depression, single episode  AXIS II Deferred  AXIS III Past Medical History  Diagnosis Date  . Depression     zoloft no help   . Panic attacks   . GAD (generalized anxiety disorder)   . Serotonin syndrome  09/17/2014    on paxil + wellbutrin     AXIS IV other psychosocial or environmental problems  AXIS V 41-50 serious symptoms   Treatment Plan/Recommendations:  Plan of Care: Medication management   Laboratory:    Psychotherapy: He is seeing Sudie Bailey here   Medications: He will continue Viibryd 40 mg daily for depression. He will discontinue Ativan and start Xanax 1 mg 4 times a day for anxiety   Routine PRN Medications:  No  Consultations:   Safety Concerns:  He denies thoughts of harm to self or others   Other:  He'll return in 6 weeks     Diannia Ruder, MD 6/17/20164:40 PM

## 2015-06-02 ENCOUNTER — Telehealth (HOSPITAL_COMMUNITY): Payer: Self-pay | Admitting: *Deleted

## 2015-06-02 NOTE — Telephone Encounter (Signed)
Pt called stating even with his deductible being met, his pay is too much ($125-175) per month. Per pt he would like to see if Dr. Harrington Challenger could give him some samples of Viibryd for 40 mg. Pt number [phone].

## 2015-06-03 ENCOUNTER — Telehealth (HOSPITAL_COMMUNITY): Payer: Self-pay | Admitting: *Deleted

## 2015-06-03 NOTE — Telephone Encounter (Signed)
his insurance will only pay half for the Vibrid, the new med you prescribed for him.  He would have to pay $200 a month.    do you have samples, or can you put him back on what he was on until he can see you again.   He can't afford to pay $200.

## 2015-06-03 NOTE — Telephone Encounter (Signed)
Samples and coupons provided 

## 2015-06-04 NOTE — Telephone Encounter (Signed)
Pt aware and showed understanding 

## 2015-06-05 ENCOUNTER — Telehealth (HOSPITAL_COMMUNITY): Payer: Self-pay | Admitting: *Deleted

## 2015-06-05 NOTE — Telephone Encounter (Signed)
Pt came into office to pick up Viibryd Samples and discount card. Pt D/L number is [phone]. Pt agreed

## 2015-06-09 ENCOUNTER — Telehealth (HOSPITAL_COMMUNITY): Payer: Self-pay | Admitting: *Deleted

## 2015-06-26 ENCOUNTER — Ambulatory Visit (HOSPITAL_COMMUNITY): Payer: Self-pay | Admitting: Psychology

## 2015-07-04 NOTE — Progress Notes (Signed)
PROGRESS NOTE     Patient:  Allen Conley. Tener   DOB: 02-27-1971  MR Number: [id number]  Location: [facility] [address] [place] [address] Dept: [phone]  Start: 4 PM End: 5 PM  Provider/Observer:     Hershal Coria PSYD  Chief Complaint:      Chief Complaint  Patient presents with  . Anxiety  . Depression    Reason For Service:    The patient was referred for psychotherapeutic interventions for depression and anxiety. He is a 44 year old Caucasian male who has been dealing with anxiety significant 5 or more years. The patient describes depression and anxiety as well as racing thoughts and feelings of hopelessness. The patient reports were gradually to catch the people too quickly and these relationships fall apart. He also describes symptoms consistent with panic attacks in the past and it resurface. The patient reports that he always lived with his grandmother and then when the patient was 77 years old his grandmother started getting sick. The patient to stay in her home and take care of her and she was very sick and not wanting to move to a nursing home. The grandmother passed away 2 years ago and since that time the patient has been laid off from work. Do to financial difficulties from his loss of job need to move in with his mother and family. The patient reports that the situation was very chaotic but that he has been able to move out he was on more recently. Currently, he reports he got a good job and is living on his on doing okay financially.   Interventions Strategy:  Cognitive/behavioral psychotherapeutic interventions  Participation Level:   Active  Participation Quality:  Appropriate      Behavioral Observation:  Well Groomed, Alert, and Appropriate.   Current Psychosocial Factors: The patient the patient returns today reporting that he has been doing better up until  recently. He reports that he has had more stress and anxiety. The patient reports that stressors related to relationships continue to be a major issue is his focus of her therapeutic interventions.  Content of Session:   Review current symptoms and continue to work on therapeutic interventions.  Current Status:   The patient reports that the depression anxiety has been more prevalent over the past month or so. He reports that he developed serotonergic syndrome and may abruptly stop the SSRI medication and are looking at other antidepressants.  Patient Progress:   Stable with active participation by the patient.  Target Goals:   Target goals include reducing the intensity, severity, and duration of symptoms of depression, anxiety and frequency of panic attacks.  Last Reviewed:   04/15/2015  Goals Addressed Today:    Goals addressed today have to do with panic symptoms primarily but also with recurrent symptoms of depression.  Impression/Diagnosis:  The patient has a long history of very poor self-esteem difficult to establish major and significant relationships. The patient reports that his issues of self-esteem leave her vulnerable to try to make quick relationships and confusion around how to establish romantic relationships. The patient has a prior history of panic attacks and this does appear to resurface. He also describes symptoms consistent with recurrent major depression without psychosis as well as generalized anxiety disorder.   Diagnosis:    Axis I: Major depressive disorder, single episode, severe without psychotic features  Generalized anxiety disorder  Panic disorder with agoraphobia

## 2015-07-11 ENCOUNTER — Ambulatory Visit (HOSPITAL_COMMUNITY): Payer: Self-pay | Admitting: Psychiatry

## 2015-07-16 ENCOUNTER — Telehealth (HOSPITAL_COMMUNITY): Payer: Self-pay | Admitting: *Deleted

## 2015-07-16 NOTE — Telephone Encounter (Signed)
Pt called stating that he need samples of his Viibryd 40 mg QD. Pt number is [phone].

## 2015-07-17 NOTE — Telephone Encounter (Signed)
lmtcb

## 2015-07-17 NOTE — Telephone Encounter (Signed)
Will get samples ready

## 2015-07-17 NOTE — Telephone Encounter (Signed)
Lmtcb. Number provided 

## 2015-07-18 ENCOUNTER — Telehealth (HOSPITAL_COMMUNITY): Payer: Self-pay | Admitting: *Deleted

## 2015-07-18 NOTE — Telephone Encounter (Signed)
Informed pt that his samples are ready and pt agreed

## 2015-07-18 NOTE — Telephone Encounter (Signed)
Pt came into office to pick up samples for his Viibryd. Pt is on 40 mg but was given /20mg  packs. Pt was given 2 packs. Pt D/L number is [id number] and expires 03-09-2016.

## 2015-07-18 NOTE — Telephone Encounter (Signed)
lm

## 2015-07-22 ENCOUNTER — Ambulatory Visit (HOSPITAL_COMMUNITY): Payer: Self-pay | Admitting: Psychiatry

## 2015-07-31 ENCOUNTER — Ambulatory Visit (INDEPENDENT_AMBULATORY_CARE_PROVIDER_SITE_OTHER): Payer: 59 | Admitting: Psychology

## 2015-07-31 DIAGNOSIS — F411 Generalized anxiety disorder: Secondary | ICD-10-CM

## 2015-07-31 DIAGNOSIS — F322 Major depressive disorder, single episode, severe without psychotic features: Secondary | ICD-10-CM | POA: Diagnosis not present

## 2015-08-06 ENCOUNTER — Telehealth (HOSPITAL_COMMUNITY): Payer: Self-pay | Admitting: *Deleted

## 2015-08-06 ENCOUNTER — Ambulatory Visit (HOSPITAL_COMMUNITY): Payer: Self-pay | Admitting: Psychiatry

## 2015-08-06 NOTE — Telephone Encounter (Signed)
Pt appt was suppose to be seen 08-06-15 but appt was cancelled. Per pt, his medication is not working and would like to know what to do. Per pt, he don't know if he should stop taking medication. Pt is resch for 08-08-15.

## 2015-08-07 NOTE — Telephone Encounter (Signed)
Stay on med until I see him tomorrow

## 2015-08-08 ENCOUNTER — Ambulatory Visit (INDEPENDENT_AMBULATORY_CARE_PROVIDER_SITE_OTHER): Payer: 59 | Admitting: Psychiatry

## 2015-08-08 ENCOUNTER — Encounter (HOSPITAL_COMMUNITY): Payer: Self-pay | Admitting: Psychiatry

## 2015-08-08 VITALS — BP 125/67 | HR 90 | Ht 67.0 in | Wt 163.2 lb

## 2015-08-08 DIAGNOSIS — F329 Major depressive disorder, single episode, unspecified: Secondary | ICD-10-CM | POA: Diagnosis not present

## 2015-08-08 DIAGNOSIS — F322 Major depressive disorder, single episode, severe without psychotic features: Secondary | ICD-10-CM

## 2015-08-08 DIAGNOSIS — F411 Generalized anxiety disorder: Secondary | ICD-10-CM

## 2015-08-08 MED ORDER — ALPRAZOLAM 1 MG PO TABS: 1.0000 mg | ORAL_TABLET | Freq: Four times a day (QID) | ORAL | Status: DC

## 2015-08-08 MED ORDER — DULOXETINE HCL 60 MG PO CPEP: 60.0000 mg | ORAL_CAPSULE | Freq: Every day | ORAL | Status: DC

## 2015-08-08 NOTE — Telephone Encounter (Signed)
Pt have appt today with provider

## 2015-08-08 NOTE — Progress Notes (Signed)
Patient ID: Cherlynn June. Copen, male   DOB: 1971-10-30, 44 y.o.   MRN: [id number] Patient ID: Kristen Bushway. Farias, male   DOB: 1971/01/09, 44 y.o.   MRN: [id number] Patient ID: Mandela Bello. Wieting, male   DOB: [birth year], 44 y.o.   MRN: [id number] Patient ID: Tinsley Lomas. Richison, male   DOB: Apr 14, 1971, 44 y.o.   MRN: [id number]  Psychiatric Assessment Adult  [name] Date of Evaluation:  08/08/2015 Chief Complaint: "I have a new job History of Chief Complaint:   Chief Complaint  Patient presents with  . Depression  . Follow-up    Depression        Past medical history includes anxiety.   Anxiety Symptoms include nervous/anxious behavior.     this patient is a 44 year old single white male lives alone in [place]. He works for [employer].  The patient was referred by his primary physician, Dr. Regino Schultze, for further assessment and treatment of depression and anxiety.  The patient states that his depression has been building over the last several years. As a baby his mother gave him up to his maternal grandmother and she raised him off his life. When he turned about 44 years old his grandmother's health declined. Between the ages of 13 and 44 he did nothing but go to work school and take care of his grandmother. He claims he "didn't have a life". His grandmother died when he was 44 and this was very difficult for him. He moved in with his mother her husband and his brother but he can get along with them because little lifestyle there was noisy and chaotic.  About a year ago he moved out on his own. He was hoping things would improve but instead he's gotten increasingly depressed. He was dating a girl for a while and she moved away several months ago and he is been really on the bottom since then. He feels sad all the time, nothing makes him happy, his energy is low he's very anxious and has panic attacks and breakdowns he worries and ruminates about everything constantly He is sleeping and  eating well. He denies overt suicidal ideation but feels like it wouldn't matter if his life ended. On the positive side he is very conscious about health and exercise, works out frequently and runs. He does not have any psychotic symptoms. He was drinking a lot until about 3 months ago but is cut it down considerably to 1 or 2 drinks every few weeks. He does not use drugs.  Dr. Diona Browner tried him on Zoloft, Paxil, then a combination of Paxil and Wellbutrin which caused severe tremors. More recently he's been on lithium but couldn't tolerate it. He was on clonazepam which did not help his anxiety and now he is on Ativan. He doesn't seem to help but he waits until he is having a panic attack to take it  The patient returns after 2 months. He states that he is really had a low point. For while the Viibryd was helping but now he feels worse than ever. He ran out of it last week and he has not gotten more because it has not helped for the last 4 weeks. 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 He stays depressed and desponded all the time and very anxious. He's had passive suicidal thoughts but has no plan and no access to weapons. He feels lonely because when he is awake people are sleeping and vice versa.  I encouraged him to get up with friends whenever he could and to try to get some sunlight. He's never tried any SNR eyes so we can go ahead and try Cymbalta. I also told about the options of ECT, transcranial magnetic therapy and ketamine treatment. He is going to think about these as well Review of Systems  Constitutional: Positive for activity change.  Eyes: Negative.   Respiratory: Negative.   Cardiovascular: Negative.   Gastrointestinal: Negative.   Endocrine: Negative.   Genitourinary: Negative.   Musculoskeletal: Negative.   Allergic/Immunologic: Negative.   Neurological: Negative.   Hematological: Negative.   Psychiatric/Behavioral: Positive for depression and dysphoric mood. The  patient is nervous/anxious.    Physical Exam not done  Depressive Symptoms: depressed mood, anhedonia, feelings of worthlessness/guilt, difficulty concentrating, hopelessness, anxiety, panic attacks, loss of energy/fatigue,  (Hypo) Manic Symptoms:   Elevated Mood:  No Irritable Mood:  No Grandiosity:  No Distractibility:  No Labiality of Mood:  No Delusions:  No Hallucinations:  No Impulsivity:  No Sexually Inappropriate Behavior:  No Financial Extravagance:  No Flight of Ideas:  No  Anxiety Symptoms: Excessive Worry:  Yes Panic Symptoms:  Yes Agoraphobia:  No Obsessive Compulsive: No  Symptoms: None, Specific Phobias:  No Social Anxiety:  No  Psychotic Symptoms:  Hallucinations: No None Delusions:  No Paranoia:  No   Ideas of Reference:  No  PTSD Symptoms: Ever had a traumatic exposure:  No Had a traumatic exposure in the last month:  No Re-experiencing: No None Hypervigilance:  No Hyperarousal: No None Avoidance: No None  Traumatic Brain Injury: No   Past Psychiatric History: Diagnosis: Depression, anxiety   Hospitalizations: none  Outpatient Care: Only through primary care   Substance Abuse Care: none  Self-Mutilation: none  Suicidal Attempts: none  Violent Behaviors: none   Past Medical History:   Past Medical History  Diagnosis Date  . Depression     zoloft no help   . Panic attacks   . GAD (generalized anxiety disorder)   . Serotonin syndrome 09/17/2014    on paxil + wellbutrin   History of Loss of Consciousness:  No Seizure History:  No Cardiac History:  No Allergies:  No Known Allergies Current Medications:  Current Outpatient Prescriptions  Medication Sig Dispense Refill  . ALPRAZolam (XANAX) 1 MG tablet Take 1 tablet (1 mg total) by mouth 4 (four) times daily. 120 tablet 2  . DULoxetine (CYMBALTA) 60 MG capsule Take 1 capsule (60 mg total) by mouth daily. 30 capsule 2   No current facility-administered medications for this  visit.    Previous Psychotropic Medications:  Medication Dose   Zoloft, Paxil, Wellbutrin, clonazepam                        Substance Abuse History in the last 12 months: Substance Age of 1st Use Last Use Amount Specific Type  Nicotine      Alcohol    drinks very occasionally    Cannabis      Opiates      Cocaine      Methamphetamines      LSD      Ecstasy      Benzodiazepines      Caffeine      Inhalants      Others:                          Medical Consequences of Substance  Abuse: none  Legal Consequences of Substance Abuse: none  Family Consequences of Substance Abuse: none  Blackouts:  No DT's:  No Withdrawal Symptoms:  No None  Social History: Current Place of Residence: [address] of Birth: [place] Family Members: Mother, stepfather, 2 brothers Marital Status:  Single Children:none Relationships: Currently not dating 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 Legal History: none Hobbies/Interests: Weightlifting and running  Family History:   Family History  Problem Relation Age of Onset  . Cancer Maternal Grandmother   . Cancer Maternal Grandfather   . Depression Mother   . Drug abuse Brother     Mental Status Examination/Evaluation: Objective:  Appearance: Casual, Neat and Well Groomed  Patent attorney::  Fair  Speech:  Clear and Coherent  Volume:  Normal  Mood:  Anxious depressed   Affect: Low sad frustrated   Thought Process:  Goal Directed  Orientation:  Full (Time, Place, and Person)  Thought Content:  Rumination  Suicidal Thoughts: Passive at no specific plan   Homicidal Thoughts:  No  Judgement:  Fair  Insight:  Fair  Psychomotor Activity:  Normal  Akathisia:  No  Handed:  Right  AIMS (if indicated):    Assets:  Communication  Skills Desire for Improvement Physical Health Resilience Talents/Skills Vocational/Educational    Laboratory/X-Ray Psychological Evaluation(s)       Assessment:  Axis I: Major Depression, single episode  AXIS I Generalized Anxiety Disorder and Major Depression, single episode  AXIS II Deferred  AXIS III Past Medical History  Diagnosis Date  . Depression     zoloft no help   . Panic attacks   . GAD (generalized anxiety disorder)   . Serotonin syndrome 09/17/2014    on paxil + wellbutrin     AXIS IV other psychosocial or environmental problems  AXIS V 41-50 serious symptoms   Treatment Plan/Recommendations:  Plan of Care: Medication management   Laboratory:    Psychotherapy: He is seeing Sudie Bailey here   Medications: He will start Cymbalta 60 mg daily for depression. he'll continue Xanax 1 mg 4 times a day for anxiety   Routine PRN Medications:  No  Consultations:   Safety Concerns:  He agrees to contract for safety and has no active plan for suicide but will call me if things get worse   Other:  He'll return in 3 weeks     Diannia Ruder, MD 8/26/20163:49 PM

## 2015-08-11 ENCOUNTER — Telehealth (HOSPITAL_COMMUNITY): Payer: Self-pay | Admitting: *Deleted

## 2015-08-11 NOTE — Telephone Encounter (Signed)
Pt came into office to pick up Xanax script that was writtten day of his appt but was left. Pt D/L number is [id number]

## 2015-08-12 ENCOUNTER — Ambulatory Visit (HOSPITAL_COMMUNITY): Payer: Self-pay | Admitting: Psychiatry

## 2015-09-01 ENCOUNTER — Ambulatory Visit (HOSPITAL_COMMUNITY): Payer: Self-pay | Admitting: Psychiatry

## 2015-09-02 ENCOUNTER — Encounter (HOSPITAL_COMMUNITY): Payer: Self-pay | Admitting: Psychiatry

## 2015-09-02 ENCOUNTER — Ambulatory Visit (INDEPENDENT_AMBULATORY_CARE_PROVIDER_SITE_OTHER): Payer: 59 | Admitting: Psychiatry

## 2015-09-02 VITALS — BP 107/58 | HR 56 | Ht 68.0 in | Wt 161.0 lb

## 2015-09-02 DIAGNOSIS — F329 Major depressive disorder, single episode, unspecified: Secondary | ICD-10-CM | POA: Diagnosis not present

## 2015-09-02 DIAGNOSIS — F411 Generalized anxiety disorder: Secondary | ICD-10-CM

## 2015-09-02 DIAGNOSIS — F322 Major depressive disorder, single episode, severe without psychotic features: Secondary | ICD-10-CM

## 2015-09-02 NOTE — Progress Notes (Signed)
Patient ID: Allen Conley, male   DOB: [birth year], 44 y.o.   MRN: [id number] Patient ID: Allen Conley, male   DOB: [birth year], 44 y.o.   MRN: [id number] Patient ID: Allen Conley, male   DOB: 09-22-1971, 43 y.o.   MRN: [id number] Patient ID: Allen Conley, male   DOB: [birth year], 44 y.o.   MRN: [id number] Patient ID: Allen Conley, male   DOB: 12/06/1971, 44 y.o.   MRN: [id number]  Psychiatric Assessment Adult  [name] Date of Evaluation:  09/02/2015 Chief Complaint: "I'm a little better" History of Chief Complaint:   Chief Complaint  Patient presents with  . Depression  . Anxiety    Depression        Past medical history includes anxiety.   Anxiety Symptoms include nervous/anxious behavior.     this patient is a 44 year old single white male lives alone in [place]. He works for [employer].  The patient was referred by his primary physician, Dr. Regino Schultze, for further assessment and treatment of depression and anxiety.  The patient states that his depression has been building over the last several years. As a baby his mother gave him up to his maternal grandmother and she raised him off his life. When he turned about 20 years old his grandmother's health declined. Between the ages of 43 and 89 he did nothing but go to work school and take care of his grandmother. He claims he "didn't have a life". His grandmother died when he was 64 and this was very difficult for him. He moved in with his mother her husband and his brother but he can get along with them because little lifestyle there was noisy and chaotic.  About a year ago he moved out on his own. He was hoping things would improve but instead he's gotten increasingly depressed. He was dating a girl for a while and she moved away several months ago and he is been really on the bottom since then. He feels sad all the time, nothing makes him happy, his energy is low he's very anxious and has panic attacks and  breakdowns he worries and ruminates about everything constantly He is sleeping and eating well. He denies overt suicidal ideation but feels like it wouldn't matter if his life ended. On the positive side he is very conscious about health and exercise, works out frequently and runs. He does not have any psychotic symptoms. He was drinking a lot until about 3 months ago but is cut it down considerably to 1 or 2 drinks every few weeks. He does not use drugs.  Dr. Diona Browner tried him on Zoloft, Paxil, then a combination of Paxil and Wellbutrin which caused severe tremors. More recently he's been on lithium but couldn't tolerate it. He was on clonazepam which did not help his anxiety and now he is on Ativan. He doesn't seem to help but he waits until he is having a panic attack to take it  The patient returns after 3 weeks. He was quite depressed on Viibryd. Last time I changed him over to Cymbalta. He is starting to feel a little better. He still has depression but it's "in the back of my mind". He really doesn't like the timing of his job. He doesn't feel well having to sleep during the day and be up working at night. He misses out on socializing with his friends and feels more isolated. He denies suicidal ideation right now. His anxiety is pretty  bad but he doesn't take the Xanax on a schedule because of drowsiness. I told him to try to take it on a schedule even if he is taking half a pill so that we can get his anxiety symptoms down. l Review of Systems  Constitutional: Positive for activity change.  Eyes: Negative.   Respiratory: Negative.   Cardiovascular: Negative.   Gastrointestinal: Negative.   Endocrine: Negative.   Genitourinary: Negative.   Musculoskeletal: Negative.   Allergic/Immunologic: Negative.   Neurological: Negative.   Hematological: Negative.   Psychiatric/Behavioral: Positive for depression and dysphoric mood. The patient is nervous/anxious.    Physical Exam not  done  Depressive Symptoms: depressed mood, anhedonia, feelings of worthlessness/guilt, difficulty concentrating, hopelessness, anxiety, panic attacks, loss of energy/fatigue,  (Hypo) Manic Symptoms:   Elevated Mood:  No Irritable Mood:  No Grandiosity:  No Distractibility:  No Labiality of Mood:  No Delusions:  No Hallucinations:  No Impulsivity:  No Sexually Inappropriate Behavior:  No Financial Extravagance:  No Flight of Ideas:  No  Anxiety Symptoms: Excessive Worry:  Yes Panic Symptoms:  Yes Agoraphobia:  No Obsessive Compulsive: No  Symptoms: None, Specific Phobias:  No Social Anxiety:  No  Psychotic Symptoms:  Hallucinations: No None Delusions:  No Paranoia:  No   Ideas of Reference:  No  PTSD Symptoms: Ever had a traumatic exposure:  No Had a traumatic exposure in the last month:  No Re-experiencing: No None Hypervigilance:  No Hyperarousal: No None Avoidance: No None  Traumatic Brain Injury: No   Past Psychiatric History: Diagnosis: Depression, anxiety   Hospitalizations: none  Outpatient Care: Only through primary care   Substance Abuse Care: none  Self-Mutilation: none  Suicidal Attempts: none  Violent Behaviors: none   Past Medical History:   Past Medical History  Diagnosis Date  . Depression     zoloft no help   . Panic attacks   . GAD (generalized anxiety disorder)   . Serotonin syndrome 09/17/2014    on paxil + wellbutrin   History of Loss of Consciousness:  No Seizure History:  No Cardiac History:  No Allergies:  No Known Allergies Current Medications:  Current Outpatient Prescriptions  Medication Sig Dispense Refill  . ALPRAZolam (XANAX) 1 MG tablet Take 1 tablet (1 mg total) by mouth 4 (four) times daily. 120 tablet 2  . DULoxetine (CYMBALTA) 60 MG capsule Take 1 capsule (60 mg total) by mouth daily. 30 capsule 2   No current facility-administered medications for this visit.    Previous Psychotropic  Medications:  Medication Dose   Zoloft, Paxil, Wellbutrin, clonazepam                        Substance Abuse History in the last 12 months: Substance Age of 1st Use Last Use Amount Specific Type  Nicotine      Alcohol    drinks very occasionally    Cannabis      Opiates      Cocaine      Methamphetamines      LSD      Ecstasy      Benzodiazepines      Caffeine      Inhalants      Others:                          Medical Consequences of Substance Abuse: none  Legal Consequences of Substance Abuse: none  Family Consequences of  Substance Abuse: none  Blackouts:  No DT's:  No Withdrawal Symptoms:  No None  Social History: Current Place of Residence: [address] of Birth: [place] Family Members: Mother, stepfather, 2 brothers Marital Status:  Single Children:none Relationships: Currently not dating 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 Legal History: none Hobbies/Interests: Weightlifting and running  Family History:   Family History  Problem Relation Age of Onset  . Cancer Maternal Grandmother   . Cancer Maternal Grandfather   . Depression Mother   . Drug abuse Brother     Mental Status Examination/Evaluation: Objective:  Appearance: Casual, Neat and Well Groomed  Patent attorney::  Fair  Speech:  Clear and Coherent  Volume:  Normal  Mood:  Anxious  Affect: Brighter   Thought Process:  Goal Directed  Orientation:  Full (Time, Place, and Person)  Thought Content:  Rumination  Suicidal Thoughts: no  Homicidal Thoughts:  No  Judgement:  Fair  Insight:  Fair  Psychomotor Activity:  Normal  Akathisia:  No  Handed:  Right  AIMS (if indicated):    Assets:  Communication Skills Desire for Improvement Physical  Health Resilience Talents/Skills Vocational/Educational    Laboratory/X-Ray Psychological Evaluation(s)       Assessment:  Axis I: Major Depression, single episode  AXIS I Generalized Anxiety Disorder and Major Depression, single episode  AXIS II Deferred  AXIS III Past Medical History  Diagnosis Date  . Depression     zoloft no help   . Panic attacks   . GAD (generalized anxiety disorder)   . Serotonin syndrome 09/17/2014    on paxil + wellbutrin     AXIS IV other psychosocial or environmental problems  AXIS V 41-50 serious symptoms   Treatment Plan/Recommendations:  Plan of Care: Medication management   Laboratory:    Psychotherapy: He is seeing Sudie Bailey here   Medications: He will continue Cymbalta 60 mg daily for depression. he'll continue Xanax 1 mg 4 times a day for anxiety   Routine PRN Medications:  No  Consultations:   Safety Concerns:  He agrees to contract for safety and has no active plan for suicide but will call me if things get worse   Other:  He'll return in 4 weeks     Allen Ruder, MD 9/20/201610:20 AM

## 2015-09-25 ENCOUNTER — Encounter (HOSPITAL_COMMUNITY): Payer: Self-pay | Admitting: Psychology

## 2015-09-25 NOTE — Progress Notes (Signed)
PROGRESS NOTE     Patient:  Allen Conley   DOB: 02/04/1971  MR Number: [id number]  [facility] [address] [place] [id number] Dept: [phone]  Start: 4 PM End: 5 PM  Provider/Observer:     Hershal CoriaJohn R Rodenbough PSYD  Chief Complaint:      Chief Complaint  Patient presents with  . Anxiety  . Depression  . Stress    Reason For Service:    The patient was referred for psychotherapeutic interventions for depression and anxiety. He is a 44 year old Caucasian male who has been dealing with anxiety significant 5 or more years. The patient describes depression and anxiety as well as racing thoughts and feelings of hopelessness. The patient reports were gradually to catch the people too quickly and these relationships fall apart. He also describes symptoms consistent with panic attacks in the past and it resurface. The patient reports that he always lived with his grandmother and then when the patient was 44 years old his grandmother started getting sick. The patient to stay in her home and take care of her and she was very sick and not wanting to move to a nursing home. The grandmother passed away 2 years ago and since that time the patient has been laid off from work. Do to financial difficulties from his loss of job need to move in with his mother and family. The patient reports that the situation was very chaotic but that he has been able to move out he was on more recently. Currently, he reports he got a good job and is living on his on doing okay financially.   Interventions Strategy:  Cognitive/behavioral psychotherapeutic interventions  Participation Level:   Active  Participation Quality:  Appropriate      Behavioral Observation:  Well Groomed, Alert, and Appropriate.   Current Psychosocial Factors: The patient the patient returns today reporting that he has been doing  better up until recently. He reports that he has had more stress and anxiety. The patient reports that stressors related to relationships continue to be a major issue is his focus of her therapeutic interventions.  Content of Session:   Review current symptoms and continue to work on therapeutic interventions.  Current Status:   The patient reports that the depression anxiety has been more prevalent over the past month or so. He reports that he developed serotonergic syndrome and may abruptly stop the SSRI medication and are looking at other antidepressants.  Patient Progress:   Stable with active participation by the patient.  Target Goals:   Target goals include reducing the intensity, severity, and duration of symptoms of depression, anxiety and frequency of panic attacks.  Last Reviewed:   07/24/2015  Goals Addressed Today:    Goals addressed today have to do with panic symptoms primarily but also with recurrent symptoms of depression.  Impression/Diagnosis:  The patient has a long history of very poor self-esteem difficult to establish major and significant relationships. The patient reports that his issues of self-esteem leave her vulnerable to try to make quick relationships and confusion around how to establish romantic relationships. The patient has a prior history of panic attacks and this does appear to resurface. He also describes symptoms consistent with recurrent major depression without psychosis as well as generalized anxiety disorder.   Diagnosis:    Axis I: Major depressive disorder, single episode, severe without psychotic features (HCC)  Generalized anxiety disorder

## 2015-10-02 ENCOUNTER — Ambulatory Visit (HOSPITAL_COMMUNITY): Payer: Self-pay | Admitting: Psychiatry

## 2015-10-24 ENCOUNTER — Ambulatory Visit (INDEPENDENT_AMBULATORY_CARE_PROVIDER_SITE_OTHER): Payer: 59 | Admitting: Psychiatry

## 2015-10-24 ENCOUNTER — Encounter (HOSPITAL_COMMUNITY): Payer: Self-pay | Admitting: Psychiatry

## 2015-10-24 VITALS — BP 110/71 | HR 74 | Ht 68.0 in | Wt 167.0 lb

## 2015-10-24 DIAGNOSIS — F411 Generalized anxiety disorder: Secondary | ICD-10-CM

## 2015-10-24 DIAGNOSIS — F322 Major depressive disorder, single episode, severe without psychotic features: Secondary | ICD-10-CM

## 2015-10-24 MED ORDER — BUSPIRONE HCL 10 MG PO TABS: 10.0000 mg | ORAL_TABLET | Freq: Three times a day (TID) | ORAL | Status: DC

## 2015-10-24 MED ORDER — CLONAZEPAM 1 MG PO TABS: 1.0000 mg | ORAL_TABLET | Freq: Four times a day (QID) | ORAL | Status: DC

## 2015-10-24 MED ORDER — DULOXETINE HCL 60 MG PO CPEP: 60.0000 mg | ORAL_CAPSULE | Freq: Every day | ORAL | Status: DC

## 2015-10-24 NOTE — Progress Notes (Signed)
Patient ID: Allen JuneAlex N. Ephriam Conley, male   DOB: Jun 14, 1971, 44 y.o.   MRN: [id number] Patient ID: Allen Conley, male   DOB: Jun 14, 1971, 44 y.o.   MRN: [id number] Patient ID: Allen Conley, male   DOB: Jun 14, 1971, 44 y.o.   MRN: [id number] Patient ID: Allen Conley, male   DOB: Jun 14, 1971, 44 y.o.   MRN: [id number] Patient ID: Allen Conley, male   DOB: Jun 14, 1971, 44 y.o.   MRN: [id number] Patient ID: Allen Conley, male   DOB: Jun 14, 1971, 44 y.o.   MRN: [id number]  Psychiatric Assessment Adult  Patient Identification:  Allen Junelex N. Schaberg Date of Evaluation:  10/24/2015 Chief Complaint: "I'm very anxious" History of Chief Complaint:   Chief Complaint  Patient presents with  . Depression  . Anxiety  . Follow-up    Depression        Past medical history includes anxiety.   Anxiety Symptoms include nervous/anxious behavior.     this patient is a 44 year old single white male lives alone in [place]. He works for [employer].  The patient was referred by his primary physician, Dr. Regino SchultzeMcGough, for further assessment and treatment of depression and anxiety.  The patient states that his depression has been building over the last several years. As a baby his mother gave him up to his maternal grandmother and she raised him off his life. When he turned about 44 years old his grandmother's health declined. Between the ages of [age] and [age] he did nothing but go to work school and take care of his grandmother. He claims he "didn't have a life". His grandmother died when he was [age] and this was very difficult for him. He moved in with his mother her husband and his brother but he can get along with them because little lifestyle there was noisy and chaotic.  About a year ago he moved out on his own. He was hoping things would improve but instead he's gotten increasingly depressed. He was dating a girl for a while and she moved away several months ago and he is been really on the bottom since then. He feels sad all the time,  nothing makes him happy, his energy is low he's very anxious and has panic attacks and breakdowns he worries and ruminates about everything constantly He is sleeping and eating well. He denies overt suicidal ideation but feels like it wouldn't matter if his life ended. On the positive side he is very conscious about health and exercise, works out frequently and runs. He does not have any psychotic symptoms. He was drinking a lot until about 3 months ago but is cut it down considerably to 1 or 2 drinks every few weeks. He does not use drugs.  Dr. Diona BrownerMcDowell tried him on Zoloft, Paxil, then a combination of Paxil and Wellbutrin which caused severe tremors. More recently he's been on lithium but couldn't tolerate it. He was on clonazepam which did not help his anxiety and now he is on Ativan. He doesn't seem to help but he waits until he is having a panic attack to take it  The patient returns after 2 months. He is now on Cymbalta and his depression has improved. However his anxiety is quite bad. He constantly worries about what's going to happen next and how he is going to handle it. He worries about what he will do at work and also what he will do in his free time. He feels great when he is around people and his friends  but when he is alone he has severe anxiety. He admits that there are some days that he takes more Xanax than prescribed and he doesn't want to keep doing this. He has had some passive suicidal thoughts but claims he would not act on them. I suggested we switch from Xanax to clonazepam which lasts longer and also start adding BuSpar he is in agreement. Review of Systems  Constitutional: Positive for activity change.  Eyes: Negative.   Respiratory: Negative.   Cardiovascular: Negative.   Gastrointestinal: Negative.   Endocrine: Negative.   Genitourinary: Negative.   Musculoskeletal: Negative.   Allergic/Immunologic: Negative.   Neurological: Negative.   Hematological: Negative.    Psychiatric/Behavioral: Positive for depression and dysphoric mood. The patient is nervous/anxious.    Physical Exam not done  Depressive Symptoms: depressed mood, anhedonia, feelings of worthlessness/guilt, difficulty concentrating, hopelessness, anxiety, panic attacks, loss of energy/fatigue,  (Hypo) Manic Symptoms:   Elevated Mood:  No Irritable Mood:  No Grandiosity:  No Distractibility:  No Labiality of Mood:  No Delusions:  No Hallucinations:  No Impulsivity:  No Sexually Inappropriate Behavior:  No Financial Extravagance:  No Flight of Ideas:  No  Anxiety Symptoms: Excessive Worry:  Yes Panic Symptoms:  Yes Agoraphobia:  No Obsessive Compulsive: No  Symptoms: None, Specific Phobias:  No Social Anxiety:  No  Psychotic Symptoms:  Hallucinations: No None Delusions:  No Paranoia:  No   Ideas of Reference:  No  PTSD Symptoms: Ever had a traumatic exposure:  No Had a traumatic exposure in the last month:  No Re-experiencing: No None Hypervigilance:  No Hyperarousal: No None Avoidance: No None  Traumatic Brain Injury: No   Past Psychiatric History: Diagnosis: Depression, anxiety   Hospitalizations: none  Outpatient Care: Only through primary care   Substance Abuse Care: none  Self-Mutilation: none  Suicidal Attempts: none  Violent Behaviors: none   Past Medical History:   Past Medical History  Diagnosis Date  . Depression     zoloft no help   . Panic attacks   . GAD (generalized anxiety disorder)   . Serotonin syndrome 09/17/2014    on paxil + wellbutrin   History of Loss of Consciousness:  No Seizure History:  No Cardiac History:  No Allergies:  No Known Allergies Current Medications:  Current Outpatient Prescriptions  Medication Sig Dispense Refill  . DULoxetine (CYMBALTA) 60 MG capsule Take 1 capsule (60 mg total) by mouth daily. 30 capsule 2  . busPIRone (BUSPAR) 10 MG tablet Take 1 tablet (10 mg total) by mouth 3 (three) times daily.  90 tablet 2  . clonazePAM (KLONOPIN) 1 MG tablet Take 1 tablet (1 mg total) by mouth 4 (four) times daily. 120 tablet 2   No current facility-administered medications for this visit.    Previous Psychotropic Medications:  Medication Dose   Zoloft, Paxil, Wellbutrin, clonazepam                        Substance Abuse History in the last 12 months: Substance Age of 1st Use Last Use Amount Specific Type  Nicotine      Alcohol    drinks very occasionally    Cannabis      Opiates      Cocaine      Methamphetamines      LSD      Ecstasy      Benzodiazepines      Caffeine      Inhalants  Others:                          Medical Consequences of Substance Abuse: none  Legal Consequences of Substance Abuse: none  Family Consequences of Substance Abuse: none  Blackouts:  No DT's:  No Withdrawal Symptoms:  No None  Social History: Current Place of Residence: [address] of Birth: [place] Family Members: Mother, stepfather, 2 brothers Marital Status:  Single Children:none Relationships: Currently not dating 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 Legal History: none Hobbies/Interests: Weightlifting and running  Family History:   Family History  Problem Relation Age of Onset  . Cancer Maternal Grandmother   . Cancer Maternal Grandfather   . Depression Mother   . Drug abuse Brother     Mental Status Examination/Evaluation: Objective:  Appearance: Casual, Neat and Well Groomed  Patent attorney::  Fair  Speech:  Clear and Coherent  Volume:  Normal  Mood:  Anxious  Affect: Congruent   Thought Process:  Goal Directed  Orientation:  Full (Time, Place, and Person)  Thought Content:  Rumination  Suicidal Thoughts: Passive at times but no active plan   Homicidal  Thoughts:  No  Judgement:  Fair  Insight:  Fair  Psychomotor Activity:  Normal  Akathisia:  No  Handed:  Right  AIMS (if indicated):    Assets:  Communication Skills Desire for Improvement Physical Health Resilience Talents/Skills Vocational/Educational    Laboratory/X-Ray Psychological Evaluation(s)       Assessment:  Axis I: Major Depression, single episode  AXIS I Generalized Anxiety Disorder and Major Depression, single episode  AXIS II Deferred  AXIS III Past Medical History  Diagnosis Date  . Depression     zoloft no help   . Panic attacks   . GAD (generalized anxiety disorder)   . Serotonin syndrome 09/17/2014    on paxil + wellbutrin     AXIS IV other psychosocial or environmental problems  AXIS V 41-50 serious symptoms   Treatment Plan/Recommendations:  Plan of Care: Medication management   Laboratory:    Psychotherapy: He is seeing Sudie Bailey here   Medications: He will continue Cymbalta 60 mg daily for depression. We'll start BuSpar 10 mg 3 times a day for anxiety. He will discontinue Xanax and start clonazepam 1 mg 4 times a day   Routine PRN Medications:  No  Consultations:   Safety Concerns:  He agrees to contract for safety and has no active plan for suicide but will call me if things get worse   Other:  He'll return in 4 weeks     ROSS, DEBORAH, MD 11/11/201610:00 AM

## 2015-11-21 ENCOUNTER — Telehealth (HOSPITAL_COMMUNITY): Payer: Self-pay | Admitting: *Deleted

## 2015-11-21 NOTE — Telephone Encounter (Signed)
Called pt but his voicemail have not been set up yet so office is unable to leave message

## 2015-11-21 NOTE — Telephone Encounter (Signed)
phone call from patient, he need refill of generic Cymbalta.   patient uses [facility].

## 2015-11-24 ENCOUNTER — Telehealth (HOSPITAL_COMMUNITY): Payer: Self-pay | Admitting: *Deleted

## 2015-11-24 NOTE — Telephone Encounter (Signed)
Patient returned Allen Conley's phone call. 

## 2015-11-25 NOTE — Telephone Encounter (Signed)
Called pt back but unable to leave message due to not having voicemail.

## 2015-11-25 NOTE — Telephone Encounter (Signed)
Called pt but his voicemail have not been set up yet so office is unable to leave message 

## 2015-11-26 NOTE — Telephone Encounter (Signed)
Pt called office and lm. Called pt back but unable to leave message due to pt not having a voicemail box that have not been set up yet.

## 2015-11-28 ENCOUNTER — Telehealth (HOSPITAL_COMMUNITY): Payer: Self-pay | Admitting: *Deleted

## 2015-11-28 NOTE — Telephone Encounter (Signed)
Patient called, said he think Octavia returned his phone call, but he does not have voice mail.

## 2015-12-10 ENCOUNTER — Ambulatory Visit (HOSPITAL_COMMUNITY): Payer: Self-pay | Admitting: Psychiatry

## 2015-12-10 ENCOUNTER — Encounter (HOSPITAL_COMMUNITY): Payer: Self-pay | Admitting: Psychiatry

## 2015-12-23 ENCOUNTER — Telehealth (HOSPITAL_COMMUNITY): Payer: Self-pay | Admitting: *Deleted

## 2015-12-23 ENCOUNTER — Encounter (HOSPITAL_COMMUNITY): Payer: Self-pay | Admitting: Psychiatry

## 2015-12-23 ENCOUNTER — Emergency Department (HOSPITAL_COMMUNITY)
Admission: EM | Admit: 2015-12-23 | Discharge: 2015-12-24 | Disposition: A | Discharge status: Other Acute Inpatient Hospital | Payer: 59 | Attending: Psychiatry | Admitting: Psychiatry

## 2015-12-23 ENCOUNTER — Encounter (HOSPITAL_COMMUNITY): Payer: Self-pay | Admitting: Emergency Medicine

## 2015-12-23 ENCOUNTER — Ambulatory Visit (INDEPENDENT_AMBULATORY_CARE_PROVIDER_SITE_OTHER): Payer: 59 | Admitting: Psychiatry

## 2015-12-23 VITALS — BP 113/69 | HR 84 | Ht 68.0 in | Wt 174.6 lb

## 2015-12-23 DIAGNOSIS — R45851 Suicidal ideations: Secondary | ICD-10-CM | POA: Insufficient documentation

## 2015-12-23 DIAGNOSIS — F419 Anxiety disorder, unspecified: Secondary | ICD-10-CM

## 2015-12-23 DIAGNOSIS — F410 Panic disorder [episodic paroxysmal anxiety] without agoraphobia: Secondary | ICD-10-CM | POA: Insufficient documentation

## 2015-12-23 DIAGNOSIS — Z79899 Other long term (current) drug therapy: Secondary | ICD-10-CM | POA: Insufficient documentation

## 2015-12-23 DIAGNOSIS — F322 Major depressive disorder, single episode, severe without psychotic features: Secondary | ICD-10-CM | POA: Diagnosis not present

## 2015-12-23 DIAGNOSIS — Z8669 Personal history of other diseases of the nervous system and sense organs: Secondary | ICD-10-CM | POA: Insufficient documentation

## 2015-12-23 DIAGNOSIS — F329 Major depressive disorder, single episode, unspecified: Secondary | ICD-10-CM | POA: Diagnosis not present

## 2015-12-23 DIAGNOSIS — F1410 Cocaine abuse, uncomplicated: Secondary | ICD-10-CM | POA: Insufficient documentation

## 2015-12-23 DIAGNOSIS — F411 Generalized anxiety disorder: Secondary | ICD-10-CM | POA: Diagnosis not present

## 2015-12-23 DIAGNOSIS — Z008 Encounter for other general examination: Secondary | ICD-10-CM | POA: Diagnosis present

## 2015-12-23 DIAGNOSIS — F32A Depression, unspecified: Secondary | ICD-10-CM

## 2015-12-23 LAB — CBC WITH DIFFERENTIAL/PLATELET
Basophils Absolute: 0 10*3/uL (ref 0.0–0.1)
Basophils Relative: 1 %
EOS ABS: 0.1 10*3/uL (ref 0.0–0.7)
Eosinophils Relative: 3 %
HCT: 46.7 % (ref 39.0–52.0)
HEMOGLOBIN: 15.2 g/dL (ref 13.0–17.0)
LYMPHS ABS: 1.7 10*3/uL (ref 0.7–4.0)
Lymphocytes Relative: 36 %
MCH: 31.1 pg (ref 26.0–34.0)
MCHC: 32.5 g/dL (ref 30.0–36.0)
MCV: 95.7 fL (ref 78.0–100.0)
Monocytes Absolute: 0.6 10*3/uL (ref 0.1–1.0)
Monocytes Relative: 12 %
NEUTROS PCT: 48 %
Neutro Abs: 2.3 10*3/uL (ref 1.7–7.7)
Platelets: 261 10*3/uL (ref 150–400)
RBC: 4.88 MIL/uL (ref 4.22–5.81)
RDW: 12.9 % (ref 11.5–15.5)
WBC: 4.8 10*3/uL (ref 4.0–10.5)

## 2015-12-23 LAB — BASIC METABOLIC PANEL
ANION GAP: 5 (ref 5–15)
BUN: 22 mg/dL — ABNORMAL HIGH (ref 6–20)
CALCIUM: 9.3 mg/dL (ref 8.9–10.3)
CHLORIDE: 105 mmol/L (ref 101–111)
CO2: 31 mmol/L (ref 22–32)
Creatinine, Ser: 1.05 mg/dL (ref 0.61–1.24)
GFR calc non Af Amer: >60 mL/min (ref >60)
Glucose, Bld: 91 mg/dL (ref 65–99)
POTASSIUM: 4.9 mmol/L (ref 3.5–5.1)
Sodium: 141 mmol/L (ref 135–145)

## 2015-12-23 LAB — RAPID URINE DRUG SCREEN, HOSP PERFORMED
AMPHETAMINES: NOT DETECTED
Barbiturates: NOT DETECTED
Benzodiazepines: POSITIVE — AB
COCAINE: POSITIVE — AB
OPIATES: NOT DETECTED
TETRAHYDROCANNABINOL: NOT DETECTED

## 2015-12-23 LAB — ETHANOL: Alcohol, Ethyl (B): <5 mg/dL (ref <5)

## 2015-12-23 MED ORDER — DULOXETINE HCL 30 MG PO CPEP
60.0000 mg | ORAL_CAPSULE | Freq: Every day | ORAL | Status: DC
  Administered 2015-12-23: 60 mg via ORAL
  Filled 2015-12-23: qty 2

## 2015-12-23 MED ORDER — BUSPIRONE HCL 5 MG PO TABS
10.0000 mg | ORAL_TABLET | Freq: Three times a day (TID) | ORAL | Status: DC
  Filled 2015-12-23 (×2): qty 2

## 2015-12-23 MED ORDER — CLONAZEPAM 0.5 MG PO TABS
1.0000 mg | ORAL_TABLET | Freq: Four times a day (QID) | ORAL | Status: DC
  Administered 2015-12-23: 1 mg via ORAL
  Filled 2015-12-23: qty 2

## 2015-12-23 NOTE — ED Notes (Signed)
[facility] counselor called and stated pt is appropriate for placement to a psych facility.

## 2015-12-23 NOTE — Progress Notes (Signed)
[facility], to Dr. Dub MikesLugo, [phone], call report at [phone], arrival time - after 23:00, fax voluntary consent at [phone]. RN informed.  Melbourne Abtsatia Jakirah Zaun, LCSWA Disposition staff 12/23/2015 9:29 PM

## 2015-12-23 NOTE — BH Assessment (Addendum)
Tele Assessment Note   Allen Conley is an 45 y.o. male who presented voluntarily to [facility] under the behest of his psychiatrist, Dr. Tenny Crawoss. Pt is oriented x 4 and presents with pleasant mood. 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 Pt reported that he had SI last week, but had no plan. He denies SI currently. Pt also denied HI/AVH. Pt was not very forthcoming with information, but answered questions when directly asked. Upon inquiry, pt shared that he started using cocaine 6 months ago, and that last week, when he was having SI, he also started to write a suicide note.  After the assessment, counselor reviewed Dr. Tenny Crawoss' note of her visit with pt today, which indicated that pt "cut himself in large slashes on his arms", admitted to taking pain medications to cope with his anxiety, and admitted to calling into work recently due to not being able to get over his anxiety.   Diagnosis: Generalized Anxiety D/O  Past Medical History:  Past Medical History  Diagnosis Date  . Depression     zoloft no help   . Panic attacks   . GAD (generalized anxiety disorder)   . Serotonin syndrome 09/17/2014    on paxil + wellbutrin    History reviewed. No pertinent past surgical history.  Family History:  Family History  Problem Relation Age of Onset  . Cancer Maternal Grandmother   . Cancer Maternal Grandfather   . Depression Mother   . Drug abuse Brother     Social History:  reports that he has never smoked. He has never used smokeless tobacco. He reports that he drinks alcohol. He reports that he does not use illicit drugs.  Additional Social History:  Alcohol / Drug Use Pain Medications: see MAR Prescriptions: see MAR Over the Counter: see MAR History of alcohol / drug use?: Yes Longest period of sobriety (when/how long): N/A Substance #1 Name of Substance 1: Cocaine 1 - Age of First Use: 1st use 6 months  ago 1 - Amount (size/oz): unknown 1 - Frequency: once or twice a month 1 - Duration: 6 months 1 - Last Use / Amount: last week Wednesday  CIWA: CIWA-Ar BP: 127/61 mmHg Pulse Rate: 67 COWS:    PATIENT STRENGTHS: (choose at least two) Ability for insight Active sense of humor Average or above average intelligence Capable of independent living Motivation for treatment/growth Supportive family/friends Work skills  Allergies: No Known Allergies  Home Medications:  (Not in a hospital admission)  OB/GYN Status:  No LMP for male patient.  General Assessment Data Location of Assessment: [facility] Assessment: In system Is this a Tele or Face-to-Face Assessment?: Tele Assessment Is this an Initial Assessment or a Re-assessment for this encounter?: Initial Assessment Marital status: Single Is patient pregnant?: No Pregnancy Status: No Living Arrangements: Alone Can pt return to current living arrangement?: Yes Admission Status: Voluntary Is patient capable of signing voluntary admission?: Yes Referral Source: Self/Family/Friend Insurance type: [employer]  Medical Screening Exam [facility] Walk-in ONLY) Medical Exam completed: Yes  Crisis Care Plan Living Arrangements: Alone Name of Psychiatrist: Dr. Diannia Rudereborah Ross Name of Therapist: Dr. Shelva Majesticodenbaugh  Education Status Is patient currently in school?: No  Risk to self with the past 6 months Suicidal Ideation: No-Not Currently/Within Last 6 Months Has patient been a risk to self within the past 6 months prior to admission? : No Suicidal Intent: No Has  patient had any suicidal intent within the past 6 months prior to admission? : No Is patient at risk for suicide?: No Suicidal Plan?: No Has patient had any suicidal plan within the past 6 months prior to admission? : No Access to Means: No What has been your use of drugs/alcohol within the last 12 months?: see above Previous Attempts/Gestures: No Intentional Self Injurious Behavior:  None Family Suicide History: Unknown Recent stressful life event(s): Other (Comment) (Job change to night shift) Persecutory voices/beliefs?: No Depression: Yes Depression Symptoms: Insomnia, Guilt, Feeling worthless/self pity Substance abuse history and/or treatment for substance abuse?: No Suicide prevention information given to non-admitted patients: Not applicable  Risk to Others within the past 6 months Homicidal Ideation: No Does patient have any lifetime risk of violence toward others beyond the six months prior to admission? : No Thoughts of Harm to Others: No Current Homicidal Intent: No Current Homicidal Plan: No Access to Homicidal Means: No History of harm to others?: No Assessment of Violence: None Noted Does patient have access to weapons?: No Criminal Charges Pending?: No Does patient have a court date: No Is patient on probation?: No  Psychosis Hallucinations: None noted Delusions: None noted  Mental Status Report Appearance/Hygiene: Unremarkable Eye Contact: Good Motor Activity: Unremarkable Speech: Logical/coherent Level of Consciousness: Alert Mood: Pleasant Affect: Other (Comment) (unremarkable) Anxiety Level: Minimal Thought Processes: Coherent, Relevant Judgement: Unimpaired Orientation: Person, Place, Time, Situation Obsessive Compulsive Thoughts/Behaviors: None  Cognitive Functioning Concentration: Normal Memory: Recent Intact, Remote Intact IQ: Average Insight: Good Impulse Control: Good Appetite: Fair Sleep: Decreased Vegetative Symptoms: None     Prior Inpatient Therapy Prior Inpatient Therapy: No  Prior Outpatient Therapy Prior Outpatient Therapy: Yes Prior Therapy Dates: current Prior Therapy Facilty/Provider(s): [facility] Reason for Treatment: anxiety; depression Does patient have an ACCT team?: No Does patient have Intensive In-House Services?  : No Does patient have [facility] services? : No Does patient have P4CC  services?: No  ADL Screening (condition at time of admission) Is the patient deaf or have difficulty hearing?: No Does the patient have difficulty seeing, even when wearing glasses/contacts?: No Does the patient have difficulty concentrating, remembering, or making decisions?: No Does the patient have difficulty dressing or bathing?: No Does the patient have difficulty walking or climbing stairs?: No Weakness of Legs: None Weakness of Arms/Hands: None  Home Assistive Devices/Equipment Home Assistive Devices/Equipment: None  Therapy Consults (therapy consults require a physician order) PT Evaluation Needed: No OT Evalulation Needed: No SLP Evaluation Needed: No Abuse/Neglect Assessment (Assessment to be complete while patient is alone) Physical Abuse: Denies Verbal Abuse: Denies Sexual Abuse: Denies Exploitation of patient/patient's resources: Denies Self-Neglect: Denies Values / Beliefs Cultural Requests During Hospitalization: None Spiritual Requests During Hospitalization: None Consults Spiritual Care Consult Needed: No Social Work Consult Needed: No Merchant navy officer (For Healthcare) Does patient have an advance directive?: No Would patient like information on creating an advanced directive?: No - patient declined information    Additional Information 1:1 In Past 12 Months?: No CIRT Risk: No Elopement Risk: No Does patient have medical clearance?: Yes     Disposition:  Disposition Initial Assessment Completed for this Encounter: Yes Disposition of Patient: Inpatient treatment program (per Dr. Elna Breslow) Type of inpatient treatment program: Adult (TTS to seek placement)  Laddie Aquas 12/23/2015 7:31 PM

## 2015-12-23 NOTE — ED Provider Notes (Signed)
CSN: [id number]     Arrival date & time 12/23/15  1513 History   First MD Initiated Contact with Patient 12/23/15 1525     Chief Complaint  Patient presents with  . V70.1      HPI  Pt was seen at 1530. Per pt, c/o gradual onset and worsening of persistent depression and anxiety for the past several months, worse over the past week. Pt was evaluated by his Psych MD PTA, then was sent to the ED for further evaluation/admission. Pt's Psych MD states pt told her he has been "using cocaine and opiates," as well as left a suicide note 6 days ago. Pt endorses vague SI, anxiety, depression, as well as not taking his psych meds as prescribed, to ED staff. Denies HI, no hallucinations.    Past Medical History  Diagnosis Date  . Depression     zoloft no help   . Panic attacks   . GAD (generalized anxiety disorder)   . Serotonin syndrome 09/17/2014    on paxil + wellbutrin   History reviewed. No pertinent past surgical history.   Family History  Problem Relation Age of Onset  . Cancer Maternal Grandmother   . Cancer Maternal Grandfather   . Depression Mother   . Drug abuse Brother    Social History  Substance Use Topics  . Smoking status: Never Smoker   . Smokeless tobacco: Never Used  . Alcohol Use: Yes     Comment: socially    Review of Systems ROS: Statement: All systems negative except as marked or noted in the HPI; Constitutional: Negative for fever and chills. ; ; Eyes: Negative for eye pain, redness and discharge. ; ; ENMT: Negative for ear pain, hoarseness, nasal congestion, sinus pressure and sore throat. ; ; Cardiovascular: Negative for chest pain, palpitations, diaphoresis, dyspnea and peripheral edema. ; ; Respiratory: Negative for cough, wheezing and stridor. ; ; Gastrointestinal: Negative for nausea, vomiting, diarrhea, abdominal pain, blood in stool, hematemesis, jaundice and rectal bleeding. ; ; Genitourinary: Negative for dysuria, flank pain and hematuria. ; ;  Musculoskeletal: Negative for back pain and neck pain. Negative for swelling and trauma.; ; Skin: Negative for pruritus, rash, abrasions, blisters, bruising and skin lesion.; ; Neuro: Negative for headache, lightheadedness and neck stiffness. Negative for weakness, altered level of consciousness , altered mental status, extremity weakness, paresthesias, involuntary movement, seizure and syncope.; Psych:  +depression, anxiety, vague SI. No HI, no hallucinations.    Allergies  Review of patient's allergies indicates no known allergies.  Home Medications   Prior to Admission medications   Medication Sig Start Date End Date Taking? Authorizing Provider  ALPRAZolam Prudy Feeler) 1 MG tablet  10/15/15   Historical Provider, MD  busPIRone (BUSPAR) 10 MG tablet Take 1 tablet (10 mg total) by mouth 3 (three) times daily. 10/24/15   Myrlene Broker, MD  clonazePAM (KLONOPIN) 1 MG tablet Take 1 tablet (1 mg total) by mouth 4 (four) times daily. 10/24/15 10/23/16  Myrlene Broker, MD  DULoxetine (CYMBALTA) 60 MG capsule Take 1 capsule (60 mg total) by mouth daily. 10/24/15 10/23/16  Myrlene Broker, MD   BP 127/61 mmHg  Pulse 67  Temp(Src) 97.7 F (36.5 C) (Oral)  Resp 16  Ht 5\' 8"  (1.727 m)  Wt 174 lb (78.926 kg)  BMI 26.46 kg/m2  SpO2 100% Physical Exam  1535: Physical examination:  Nursing notes reviewed; Vital signs and O2 SAT reviewed;  Constitutional: Well developed, Well nourished, Well hydrated, In no  acute distress; Head:  Normocephalic, atraumatic; Eyes: EOMI, PERRL, No scleral icterus; ENMT: Mouth and pharynx normal, Mucous membranes moist; Neck: Supple, Full range of motion; Cardiovascular: Regular rate and rhythm; Respiratory: Breath sounds clear, No wheezes.  Speaking full sentences with ease, Normal respiratory effort/excursion; Chest: No deformity, Movement normal; Abdomen: Nondistended; Extremities: No deformity.; Neuro: AA&Ox3, Major CN grossly intact.  Speech clear. No gross focal motor  deficits in extremities. Climbs on and off stretcher easily by himself. Gait steady.; Skin: Color normal, Warm, Dry.; Psych:  Affect flat.     ED Course  Procedures (including critical care time)  Labs Review  Imaging Review  I have personally reviewed and evaluated these images and lab results as part of my medical decision-making.   EKG Interpretation None      MDM  MDM Reviewed: nursing note, vitals and previous chart Interpretation: labs      Results for orders placed or performed during the hospital encounter of 12/23/15  Basic metabolic panel  Result Value Ref Range   Sodium 141 135 - 145 mmol/L   Potassium 4.9 3.5 - 5.1 mmol/L   Chloride 105 101 - 111 mmol/L   CO2 31 22 - 32 mmol/L   Glucose, Bld 91 65 - 99 mg/dL   BUN 22 (H) 6 - 20 mg/dL   Creatinine, Ser 4.091.05 0.61 - 1.24 mg/dL   Calcium 9.3 8.9 - 81.110.3 mg/dL   GFR calc non Af Amer >60 >60 mL/min   GFR calc Af Amer >60 >60 mL/min   Anion gap 5 5 - 15  Ethanol  Result Value Ref Range   Alcohol, Ethyl (B) <5 <5 mg/dL  CBC with Differential  Result Value Ref Range   WBC 4.8 4.0 - 10.5 K/uL   RBC 4.88 4.22 - 5.81 MIL/uL   Hemoglobin 15.2 13.0 - 17.0 g/dL   HCT 91.446.7 78.239.0 - 95.652.0 %   MCV 95.7 78.0 - 100.0 fL   MCH 31.1 26.0 - 34.0 pg   MCHC 32.5 30.0 - 36.0 g/dL   RDW 21.312.9 08.611.5 - 57.815.5 %   Platelets 261 150 - 400 K/uL   Neutrophils Relative % 48 %   Neutro Abs 2.3 1.7 - 7.7 K/uL   Lymphocytes Relative 36 %   Lymphs Abs 1.7 0.7 - 4.0 K/uL   Monocytes Relative 12 %   Monocytes Absolute 0.6 0.1 - 1.0 K/uL   Eosinophils Relative 3 %   Eosinophils Absolute 0.1 0.0 - 0.7 K/uL   Basophils Relative 1 %   Basophils Absolute 0.0 0.0 - 0.1 K/uL  Urine rapid drug screen (hosp performed)  Result Value Ref Range   Opiates NONE DETECTED NONE DETECTED   Cocaine POSITIVE (A) NONE DETECTED   Benzodiazepines POSITIVE (A) NONE DETECTED   Amphetamines NONE DETECTED NONE DETECTED   Tetrahydrocannabinol NONE DETECTED  NONE DETECTED   Barbiturates NONE DETECTED NONE DETECTED    1645:  TTS eval pending.  1845:  TTS eval completed: inpt recommended, placement pending. Holding orders written.   2135:  [facility]   Samuel JesterKathleen Magdala Brahmbhatt, DO 12/23/15 2340

## 2015-12-23 NOTE — ED Notes (Signed)
Pt left the department with [employer]; attempted to call report and no answer

## 2015-12-23 NOTE — ED Notes (Signed)
I spoke with Dr. Tenny Crawoss at this time and she stated pt came from her office to ED d/t pt told Dr. Tenny Crawoss he had made a SI note has been absuing cocaine and not going to work recently. PT is voluntary at this time bc Dr. Tenny Crawoss told him she would do IVC papers if he didn't get evaluated.

## 2015-12-23 NOTE — ED Notes (Signed)
MD at bedside. 

## 2015-12-23 NOTE — Progress Notes (Signed)
Patient ID: Allen Conley, male   DOB: Sep 12, 1971, 45 y.o.   MRN: [id number] Patient ID: Allen Conley, male   DOB: 16-Dec-1970, 45 y.o.   MRN: [id number] Patient ID: Allen Conley, male   DOB: 06/07/1971, 45 y.o.   MRN: [id number] Patient ID: Allen Conley, male   DOB: 1971/12/05, 45 y.o.   MRN: [id number] Patient ID: Allen Conley, male   DOB: 10/17/1971, 45 y.o.   MRN: [id number] Patient ID: Allen Conley, male   DOB: March 23, 1971, 45 y.o.   MRN: [id number] Patient ID: Allen Conley, male   DOB: 1971-01-25, 45 y.o.   MRN: [id number]  Psychiatric Assessment Adult  [name]. [place] Date of Evaluation:  12/23/2015 Chief Complaint: "I've been cutting myself and using drugs History of Chief Complaint:   Chief Complaint  Patient presents with  . Depression  . Anxiety  . Drug Problem  . Follow-up    Depression        Past medical history includes anxiety.   Anxiety Symptoms include nervous/anxious behavior.    Drug Problem   this patient is a 45 year old single white male lives alone in [place]. He works for [employer].  The patient was referred by his primary physician, Dr. Regino Schultze, for further assessment and treatment of depression and anxiety.  The patient states that his depression has been building over the last several years. As a baby his mother gave him up to his maternal grandmother and she raised him off his life. When he turned about 45 years old his grandmother's health declined. Between the ages of 45 and 86 he did nothing but go to work school and take care of his grandmother. He claims he "didn't have a life". His grandmother died when he was 45 and this was very difficult for him. He moved in with his mother her husband and his brother but he can get along with them because little lifestyle there was noisy and chaotic.  About a year ago he moved out on his own. He was hoping things would improve but instead he's gotten increasingly depressed. He was  dating a girl for a while and she moved away several months ago and he is been really on the bottom since then. He feels sad all the time, nothing makes him happy, his energy is low he's very anxious and has panic attacks and breakdowns he worries and ruminates about everything constantly He is sleeping and eating well. He denies overt suicidal ideation but feels like it wouldn't matter if his life ended. On the positive side he is very conscious about health and exercise, works out frequently and runs. He does not have any psychotic symptoms. He was drinking a lot until about 3 months ago but is cut it down considerably to 1 or 2 drinks every few weeks. He does not use drugs.  Dr. Diona Browner tried him on Zoloft, Paxil, then a combination of Paxil and Wellbutrin which caused severe tremors. More recently he's been on lithium but couldn't tolerate it. He was on clonazepam which did not help his anxiety and now he is on Ativan. He doesn't seem to help but he waits until he is having a panic attack to take it  The patient returns after 2 months. He is is not doing well at all that he has not called me or his therapist here to inform us of his downside. Apparently for the last several months he's been using cocaine. When the  cocaine wears off his anxiety is twice as bad. He also admits using pain medications to try to alleviate his anxiety. He has also cut himself in large slashes on his arms. He took himself out of work yesterday because he couldn't function there anymore. He states he is not as depressed as he is anxious and the anxiety is so overwhelming he can't function without these drugs. I explained to him that his current behaviors are quite dangerous and he will need of immediate hospitalization. He is initially reluctant and then agreed to [facility] Review of Systems  Constitutional: Positive for activity change.  Eyes: Negative.   Respiratory: Negative.   Cardiovascular:  Negative.   Gastrointestinal: Negative.   Endocrine: Negative.   Genitourinary: Negative.   Musculoskeletal: Negative.   Allergic/Immunologic: Negative.   Neurological: Negative.   Hematological: Negative.   Psychiatric/Behavioral: Positive for depression and dysphoric mood. The patient is nervous/anxious.    Physical Exam not done  Depressive Symptoms: depressed mood, anhedonia, feelings of worthlessness/guilt, difficulty concentrating, hopelessness, anxiety, panic attacks, loss of energy/fatigue,  (Hypo) Manic Symptoms:   Elevated Mood:  No Irritable Mood:  No Grandiosity:  No Distractibility:  No Labiality of Mood:  No Delusions:  No Hallucinations:  No Impulsivity:  No Sexually Inappropriate Behavior:  No Financial Extravagance:  No Flight of Ideas:  No  Anxiety Symptoms: Excessive Worry:  Yes Panic Symptoms:  Yes Agoraphobia:  No Obsessive Compulsive: No  Symptoms: None, Specific Phobias:  No Social Anxiety:  No  Psychotic Symptoms:  Hallucinations: No None Delusions:  No Paranoia:  No   Ideas of Reference:  No  PTSD Symptoms: Ever had a traumatic exposure:  No Had a traumatic exposure in the last month:  No Re-experiencing: No None Hypervigilance:  No Hyperarousal: No None Avoidance: No None  Traumatic Brain Injury: No   Past Psychiatric History: Diagnosis: Depression, anxiety   Hospitalizations: none  Outpatient Care: Only through primary care   Substance Abuse Care: none  Self-Mutilation: none  Suicidal Attempts: none  Violent Behaviors: none   Past Medical History:   Past Medical History  Diagnosis Date  . Depression     zoloft no help   . Panic attacks   . GAD (generalized anxiety disorder)   . Serotonin syndrome 09/17/2014    on paxil + wellbutrin   History of Loss of Consciousness:  No Seizure History:  No Cardiac History:  No Allergies:  No Known Allergies Current Medications:  Current Outpatient Prescriptions  Medication  Sig Dispense Refill  . busPIRone (BUSPAR) 10 MG tablet Take 1 tablet (10 mg total) by mouth 3 (three) times daily. 90 tablet 2  . clonazePAM (KLONOPIN) 1 MG tablet Take 1 tablet (1 mg total) by mouth 4 (four) times daily. 120 tablet 2  . DULoxetine (CYMBALTA) 60 MG capsule Take 1 capsule (60 mg total) by mouth daily. 30 capsule 2   No current facility-administered medications for this visit.    Previous Psychotropic Medications:  Medication Dose   Zoloft, Paxil, Wellbutrin, clonazepam                        Substance Abuse History in the last 12 months: Substance Age of 1st Use Last Use Amount Specific Type  Nicotine      Alcohol    drinks very occasionally    Cannabis      Opiates      Cocaine  Methamphetamines      LSD      Ecstasy      Benzodiazepines      Caffeine      Inhalants      Others:                          Medical Consequences of Substance Abuse: none  Legal Consequences of Substance Abuse: none  Family Consequences of Substance Abuse: none  Blackouts:  No DT's:  No Withdrawal Symptoms:  No None  Social History: Current Place of Residence: [address] of Birth: [place] Family Members: Mother, stepfather, 2 brothers Marital Status:  Single Children:none Relationships: Currently not dating 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 Legal History: none Hobbies/Interests: Weightlifting and running  Family History:   Family History  Problem Relation Age of Onset  . Cancer Maternal Grandmother   . Cancer Maternal Grandfather   . Depression Mother   . Drug abuse Brother     Mental Status Examination/Evaluation: Objective:  Appearance: Casual, Neat and Well Groomed large/marks on both forearms   Eye Contact::  Fair  Speech:  Clear and  Coherent  Volume:  Normal  Mood:  Anxious  Affect: Extremely anxious   Thought Process:  Goal Directed  Orientation:  Full (Time, Place, and Person)  Thought Content:  Rumination  Suicidal Thoughts: Patient admits he had written a suicide note 6 days ago and has been contemplating suicide   Homicidal Thoughts:  No  Judgement:  poor  Insight:poor  Psychomotor Activity: Restlessness   Akathisia:  No  Handed:  Right  AIMS (if indicated):    Assets:  Communication Skills Desire for Improvement Physical Health Resilience Talents/Skills Vocational/Educational    Laboratory/X-Ray Psychological Evaluation(s)       Assessment:  Axis I: Major Depression, single episode  AXIS I Generalized Anxiety Disorder and Major Depression, single episode  AXIS II Deferred  AXIS III Past Medical History  Diagnosis Date  . Depression     zoloft no help   . Panic attacks   . GAD (generalized anxiety disorder)   . Serotonin syndrome 09/17/2014    on paxil + wellbutrin     AXIS IV other psychosocial or environmental problems  AXIS V 41-50 serious symptoms   Treatment Plan/Recommendations:  Plan of Care: Medication management   Laboratory:    Psychotherapy: He is seeing Sudie Bailey here   Medications: Return determined in the hospital setting   Routine PRN Medications:  No  Consultations:   Safety Concerns:  He is been engaging in self-harm and has had active suicidal ideation within the last 6 days   Other:  He is been sent for immediate admission to [facility] ED. We will call to let them know he is coming in for assessment     Diannia Ruder, MD 1/10/20172:53 PM

## 2015-12-23 NOTE — Progress Notes (Signed)
[facility].  Patient has been referred for inpatient [facility] at: [facility] pr [place], fax referral for review. [facility] - per intake, fax referral for waitlist. [facility] - per Leanora CoverShonte, fax referral for d/c's tomorrow, no beds tonight.  At [facility]: [facility] - [facility], only child adolescent beds available Earlene Plateravis  [facility] - per Samul DadaPat [place]   CSW will continue to seek placement.  [place] Abtsatia Anarosa Kubisiak, LCSWA Disposition staff 12/23/2015 8:48 PM

## 2015-12-23 NOTE — ED Notes (Signed)
Pt states that he saw Dr. Tenny Crawoss today for depression and was sent over for a psych eval.  States that he had thoughts of harming himself a few days ago but was playing on his playstation so he does not feel that he was very serious about it.  States that he becomes very anxious at times.

## 2015-12-23 NOTE — Telephone Encounter (Signed)
Per Dr. Tenny Crawoss to call [facility] and inform them that pt will be coming to office to get evaluated. Pt is aware and was called ER and they are aware.

## 2015-12-23 NOTE — Progress Notes (Signed)
[facility].  Mel[place] Abtsatia Nianna Igo, LCSWA Disposition staff 12/23/2015 9:55 PM

## 2015-12-23 NOTE — ED Notes (Signed)
[facility] with [facility] and pt can be transferred after 11pm; pt signed voluntary admission form and faxed to [phone]

## 2015-12-23 NOTE — Patient Instructions (Signed)
Please go across the street [facility]

## 2015-12-24 ENCOUNTER — Inpatient Hospital Stay (HOSPITAL_COMMUNITY)
Admission: AD | Admit: 2015-12-24 | Discharge: 2015-12-25 | DRG: 885 | Disposition: A | Discharge status: Home / Self Care | Payer: 59 | Source: Intra-hospital | Attending: Psychiatry | Admitting: Psychiatry

## 2015-12-24 ENCOUNTER — Encounter (HOSPITAL_COMMUNITY): Payer: Self-pay

## 2015-12-24 DIAGNOSIS — F339 Major depressive disorder, recurrent, unspecified: Secondary | ICD-10-CM | POA: Diagnosis present

## 2015-12-24 DIAGNOSIS — F331 Major depressive disorder, recurrent, moderate: Secondary | ICD-10-CM | POA: Insufficient documentation

## 2015-12-24 DIAGNOSIS — F411 Generalized anxiety disorder: Secondary | ICD-10-CM | POA: Diagnosis present

## 2015-12-24 DIAGNOSIS — Z818 Family history of other mental and behavioral disorders: Secondary | ICD-10-CM

## 2015-12-24 MED ORDER — SALINE SPRAY 0.65 % NA SOLN
1.0000 | NASAL | Status: DC | PRN
  Administered 2015-12-24 – 2015-12-25 (×5): 1 via NASAL
  Filled 2015-12-24: qty 44

## 2015-12-24 MED ORDER — HYDROXYZINE HCL 50 MG PO TABS
50.0000 mg | ORAL_TABLET | Freq: Once | ORAL | Status: AC | PRN
  Administered 2015-12-24: 50 mg via ORAL
  Filled 2015-12-24: qty 1

## 2015-12-24 MED ORDER — CLONAZEPAM 1 MG PO TABS
1.0000 mg | ORAL_TABLET | Freq: Two times a day (BID) | ORAL | Status: DC | PRN
  Administered 2015-12-24: 1 mg via ORAL
  Filled 2015-12-24: qty 1

## 2015-12-24 MED ORDER — BUSPIRONE HCL 15 MG PO TABS
15.0000 mg | ORAL_TABLET | Freq: Two times a day (BID) | ORAL | Status: DC
  Administered 2015-12-24 – 2015-12-25 (×3): 15 mg via ORAL
  Filled 2015-12-24 (×7): qty 1

## 2015-12-24 MED ORDER — DULOXETINE HCL 60 MG PO CPEP
60.0000 mg | ORAL_CAPSULE | Freq: Every day | ORAL | Status: DC
  Administered 2015-12-24 – 2015-12-25 (×2): 60 mg via ORAL
  Filled 2015-12-24 (×4): qty 1

## 2015-12-24 MED ORDER — ALUM & MAG HYDROXIDE-SIMETH 200-200-20 MG/5ML PO SUSP: 30.0000 mL | ORAL | Status: DC | PRN

## 2015-12-24 MED ORDER — ACETAMINOPHEN 325 MG PO TABS: 650.0000 mg | ORAL_TABLET | Freq: Four times a day (QID) | ORAL | Status: DC | PRN

## 2015-12-24 MED ORDER — MAGNESIUM HYDROXIDE 400 MG/5ML PO SUSP: 30.0000 mL | Freq: Every day | ORAL | Status: DC | PRN

## 2015-12-24 NOTE — Tx Team (Signed)
Initial Interdisciplinary Treatment Plan   PATIENT STRESSORS: Occupational concerns Substance abuse   PATIENT STRENGTHS: Ability for insight Active sense of humor Average or above average intelligence General fund of knowledge Physical Health Special hobby/interest Supportive family/friends   PROBLEM LIST: Problem List/Patient Goals Date to be addressed Date deferred Reason deferred Estimated date of resolution  "I have been feeling more depressed lately" 12/24/15     "I use cocaine when my anxiety gets real bad." 12/24/15                                                DISCHARGE CRITERIA:  Improved stabilization in mood, thinking, and/or behavior Motivation to continue treatment in a less acute level of care Withdrawal symptoms are absent or subacute and managed without 24-hour nursing intervention  PRELIMINARY DISCHARGE PLAN: Attend 12-step recovery group Outpatient therapy Return to previous living arrangement  PATIENT/FAMIILY INVOLVEMENT: This treatment plan has been presented to and reviewed with the patient, Cherlynn Junelex N. Passage.  The patient and family have been given the opportunity to ask questions and make suggestions.  Wandra MannanKimberly R Allecia Bells 12/24/2015, 2:22 AM

## 2015-12-24 NOTE — Progress Notes (Signed)
Report received from admitting RN.  Pt is currently resting in bed with eyes closed.  Respirations are even and unlabored.  Pt does not appear to be in any distress.  Will continue to monitor and assess.

## 2015-12-24 NOTE — Progress Notes (Signed)
Patient ID: Allen Conley, male   DOB: [birth year], 45 y.o.   MRN: [id number]  DAR: Pt. Denies SI/HI and A/V Hallucinations. He reports sleep is fair and appetite is poor. He rates depression and anxiety 1/10, and hopelessness 2/10. Patient does report pain/pressure in his sinuses and is given PRN saline and he reports some relief. Support and encouragement provided to the patient. Scheduled medications administered to patient per physician's orders. Patient is receptive and cooperative however remains minimal with Clinical research associatewriter. He is seen in the milieu throughout the day but remains mostly to himself. Q15 minute checks are maintained for safety.

## 2015-12-24 NOTE — Progress Notes (Signed)
Recreation Therapy Notes  Date: 01.11.2017  Time: 9:30am Location: [address] Group Room   Group Topic: Stress Management  Goal Area(s) Addresses:  Patient will actively participate in stress management techniques presented during session.   Behavioral Response: Did not attend.   Marykay Lexenise L Lindon Kiel, LRT/CTRS        Jearl KlinefelterBlanchfield, Jeffren Dombek L 12/24/2015 6:27 PM

## 2015-12-24 NOTE — Progress Notes (Signed)
Patient alert and oriented x 4. Patient denies pain/SI/HI/AVH. Patient reports he has been having increase depression and anxiety. Patient reports when his anxiety is high and Klonopin does not work he will do cocaine. Patient reports his last use was last week.  Current UDS positive for cocaine and benzos. During skin assessment, patient has self inflected superficial scratches to right and left forarms. Areas are in healing stage and scabbed over. No other areas of concern noted. Telephone orders received Vistaril 50 mg table given at 0146 for sleep. Medication was effective patient is currently resting in bed with eyes closed.

## 2015-12-24 NOTE — [facility] Suicide Risk Assessment (Signed)
[facility]   Nursing information obtained from:    Demographic factors:    Current Mental Status:    Loss Factors:    Historical Factors:    Risk Reduction Factors:    Total Time spent with patient: 45 minutes Principal Problem: GAD (generalized anxiety disorder) Diagnosis:   Patient Active Problem List   Diagnosis Date Noted  . Recurrent major depression (HCC) [F33.9] 12/24/2015  . Viral gastroenteritis [A08.4] 12/25/2014  . Bipolar II disorder (HCC) [F31.81] 10/18/2014  . Major depression (HCC) [F32.9] 07/12/2014  . GAD (generalized anxiety disorder) [F41.1] 07/12/2014  . Panic attacks [F41.0] 07/12/2014     Continued Clinical Symptoms:  Alcohol Use Disorder Identification Test Final Score (AUDIT): 0 The "Alcohol Use Disorders Identification Test", Guidelines for Use in Primary Care, Second Edition.  World Science writerHealth Organization [facility](WHO). Score between 0-7:  no or low risk or alcohol related problems. Score between 8-15:  moderate risk of alcohol related problems. Score between 16-19:  high risk of alcohol related problems. Score 20 or above:  warrants further diagnostic evaluation for alcohol dependence and treatment.   CLINICAL FACTORS:   Severe Anxiety and/or Agitation Depression:   Comorbid alcohol abuse/dependence Alcohol/Substance Abuse/Dependencies   Psychiatric Specialty Exam: Physical Exam  ROS  Blood pressure 130/74, pulse 76, temperature 97.6 F (36.4 C), temperature source Oral, resp. rate 16, height 5' 6.5" (1.689 m), weight 77.565 kg (171 lb), SpO2 100 %.Body mass index is 27.19 kg/(m^2).   COGNITIVE FEATURES THAT CONTRIBUTE TO RISK:  Closed-mindedness, Polarized thinking and Thought constriction (tunnel vision)    SUICIDE RISK:   Moderate:  Frequent suicidal ideation with limited intensity, and duration, some specificity in terms of plans, no associated intent, good self-control, limited dysphoria/symptomatology, some risk factors  present, and identifiable protective factors, including available and accessible social support.  PLAN OF CARE: see admission H and P  Medical Decision Making:  Review of Psycho-Social Stressors (1), Review or order clinical lab tests (1), Review of Medication Regimen & Side Effects (2) and Review of New Medication or Change in Dosage (2)  I certify that inpatient services furnished can reasonably be expected to improve the patient's condition.   Jouri Threat A 12/24/2015, 5:42 PM

## 2015-12-24 NOTE — H&P (Signed)
Psychiatric Admission Assessment Adult  Patient Identification: Allen Conley. Gaus MRN:  [id number] Date of Evaluation:  12/24/2015 Chief Complaint:  Generalized Anxiety Disorder Principal Diagnosis: <principal problem not specified> Diagnosis:   Patient Active Problem List   Diagnosis Date Noted  . Viral gastroenteritis [A08.4] 12/25/2014  . Bipolar II disorder ([place]) [F31.81] 10/18/2014  . Major depression ([place]) [F32.9] 07/12/2014  . GAD (generalized anxiety disorder) [F41.1] 07/12/2014  . Panic attacks [F41.0] 07/12/2014   History of Present Illness:: 45 Y/O male who states he went to see Dr. Harrington Challenger for anxiety. States it is really bad. States he has not been taking his medications. Last 7 days he has missed 3-4 times. States he works 12 hours shifts he is already wore out. Might drink acohol once every 2 months, gets a buzz. Recently went through brake up with GF of 3 years. Got depressed suicidal "for one and 1/2 hour." Superficially cut his arm.  But he has been able to deal with it. States that more than the depression what gets to him is the anxiety. States that his current work schedule has affected him negatively. He is still keeping up with his weightlifting bodybuilding program planning to be in a competition in couple of months. He does admit to using cocaine but states his is planning to stop it. He states he has not been able to keep up with his follow up appointments due to his work schedule The initial assessment is as follows: Allen Conley. Bogard is an 45 y.o. male who presented voluntarily to [facility] under the behest of his psychiatrist, Dr. Harrington Challenger. Pt is oriented x 4 and presents with pleasant mood. 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 Pt reported that he had SI last week, but had no plan. He denies SI currently. Pt also denied HI/AVH. Pt was not very forthcoming with information, but answered questions  when directly asked. Upon inquiry, pt shared that he started using cocaine 6 months ago, and that last week, when he was having SI, he also started to write a suicide note. After the assessment, counselor reviewed Dr. Harrington Challenger' note of her visit with pt today, which indicated that pt "cut himself in large slashes on his arms", admitted to taking pain medications to cope with his anxiety, and admitted to calling into work recently due to not being able to get over his anxiety.   Associated Signs/Symptoms: Depression Symptoms:  depressed mood, anxiety, disturbed sleep, (Hypo) Manic Symptoms:  Irritable Mood, Labiality of Mood, states it is related to his sleep schedule  Anxiety Symptoms:  Excessive Worry, Psychotic Symptoms:  denies PTSD Symptoms: Negative Total Time spent with patient: 45 minutes  Past Psychiatric History:   Risk to Self: Is patient at risk for suicide?: No Risk to Others:  No Prior Inpatient Therapy:  Denies Prior Outpatient Therapy:  Dr. Harrington Challenger at [facility] in the [facility] and  Dr. Sima Matas   Alcohol Screening: 1. How often do you have a drink containing alcohol?: Never 9. Have you or someone else been injured as a result of your drinking?: No 10. Has a relative or friend or a doctor or another health worker been concerned about your drinking or suggested you cut down?: No Alcohol Use Disorder Identification Test Final Score (AUDIT): 0 Brief Intervention: AUDIT score less than 7 or less-screening does not suggest unhealthy drinking-brief intervention not indicated Substance Abuse History in  the last 12 months:  Yes.   Consequences of Substance Abuse: Negative Previous Psychotropic Medications: Yes Zoloft Lexapro Wellbutrin Ativan Xanax Klonopin Cymbalta  Psychological Evaluations: No  Past Medical History:  Past Medical History  Diagnosis Date  . Depression     zoloft no help   . Panic attacks   . GAD (generalized anxiety disorder)   . Serotonin  syndrome 09/17/2014    on paxil + wellbutrin   History reviewed. No pertinent past surgical history. Family History:  Family History  Problem Relation Age of Onset  . Cancer Maternal Grandmother   . Cancer Maternal Grandfather   . Depression Mother   . Drug abuse Brother    Family Psychiatric  History: mother depression on Xanax Zoloft  Social History:  History  Alcohol Use No    Comment: socially     History  Drug Use No    Social History   Social History  . Marital Status: Single    Spouse Name: N/A  . Number of Children: N/A  . Years of Education: N/A   Social History Main Topics  . Smoking status: Never Smoker   . Smokeless tobacco: Never Used  . Alcohol Use: No     Comment: socially  . Drug Use: No  . Sexual Activity: No   Other Topics Concern  . None   Social History Narrative   Single, no children.   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   OCC: Editor, commissioning for [employer]   Lives in [place], [place].   No tobacco, occ alcohol (beer).   No hx of alc abuse or drug use.   Exercise: lifts weights 5 d/week, runs Sat/Sun  single, no children, one more year to get his undergraduate degree in  IT . Works at [employer] Social History:                         Allergies:  No Known Allergies Lab Results:  Results for orders placed or performed during the hospital encounter of 12/23/15 (from the past 48 hour(s))  Basic metabolic panel     Status: Abnormal   Collection Time: 12/23/15  3:36 PM  Result Value Ref Range   Sodium 141 135 - 145 mmol/L   Potassium 4.9 3.5 - 5.1 mmol/L   Chloride 105 101 - 111 mmol/L   CO2 31 22 - 32 mmol/L   Glucose, Bld 91 65 - 99 mg/dL   BUN 22 (H) 6 - 20 mg/dL   Creatinine, Ser 1.05 0.61 - 1.24 mg/dL   Calcium 9.3 8.9 - 10.3 mg/dL   GFR calc non Af Amer >60 >60 mL/min   GFR calc Af Amer >60 >60 mL/min    Comment: (NOTE) The eGFR has been calculated using the CKD EPI equation. This calculation has not been  validated in all clinical situations. eGFR's persistently <60 mL/min signify possible Chronic Kidney Disease.    Anion gap 5 5 - 15  Ethanol     Status: None   Collection Time: 12/23/15  3:36 PM  Result Value Ref Range   Alcohol, Ethyl (B) <5 <5 mg/dL    Comment:        LOWEST DETECTABLE LIMIT FOR SERUM ALCOHOL IS 5 mg/dL FOR MEDICAL PURPOSES ONLY   CBC with Differential     Status: None   Collection Time: 12/23/15  3:36 PM  Result Value Ref Range   WBC 4.8 4.0 - 10.5  K/uL   RBC 4.88 4.22 - 5.81 MIL/uL   Hemoglobin 15.2 13.0 - 17.0 g/dL   HCT 46.7 39.0 - 52.0 %   MCV 95.7 78.0 - 100.0 fL   MCH 31.1 26.0 - 34.0 pg   MCHC 32.5 30.0 - 36.0 g/dL   RDW 12.9 11.5 - 15.5 %   Platelets 261 150 - 400 K/uL   Neutrophils Relative % 48 %   Neutro Abs 2.3 1.7 - 7.7 K/uL   Lymphocytes Relative 36 %   Lymphs Abs 1.7 0.7 - 4.0 K/uL   Monocytes Relative 12 %   Monocytes Absolute 0.6 0.1 - 1.0 K/uL   Eosinophils Relative 3 %   Eosinophils Absolute 0.1 0.0 - 0.7 K/uL   Basophils Relative 1 %   Basophils Absolute 0.0 0.0 - 0.1 K/uL  Urine rapid drug screen (hosp performed)     Status: Abnormal   Collection Time: 12/23/15  4:16 PM  Result Value Ref Range   Opiates NONE DETECTED NONE DETECTED   Cocaine POSITIVE (A) NONE DETECTED   Benzodiazepines POSITIVE (A) NONE DETECTED   Amphetamines NONE DETECTED NONE DETECTED   Tetrahydrocannabinol NONE DETECTED NONE DETECTED   Barbiturates NONE DETECTED NONE DETECTED    Comment:        DRUG SCREEN FOR MEDICAL PURPOSES ONLY.  IF CONFIRMATION IS NEEDED FOR ANY PURPOSE, NOTIFY LAB WITHIN 5 DAYS.        LOWEST DETECTABLE LIMITS FOR URINE DRUG SCREEN Drug Class       Cutoff (ng/mL) Amphetamine      1000 Barbiturate      200 Benzodiazepine   947 Tricyclics       654 Opiates          300 Cocaine          300 THC              50     Metabolic Disorder Labs:  No results found for: HGBA1C, MPG No results found for: PROLACTIN No results found  for: CHOL, TRIG, HDL, CHOLHDL, VLDL, LDLCALC  Current Medications: Current Facility-Administered Medications  Medication Dose Route Frequency Provider Last Rate Last Dose  . acetaminophen (TYLENOL) tablet 650 mg  650 mg Oral Q6H PRN Laverle Hobby, PA-C      . alum & mag hydroxide-simeth (MAALOX/MYLANTA) 200-200-20 MG/5ML suspension 30 mL  30 mL Oral Q4H PRN Laverle Hobby, PA-C      . magnesium hydroxide (MILK OF MAGNESIA) suspension 30 mL  30 mL Oral Daily PRN Laverle Hobby, PA-C      . sodium chloride (OCEAN) 0.65 % nasal spray 1 spray  1 spray Each Nare PRN Nicholaus Bloom, MD       PTA Medications: Prescriptions prior to admission  Medication Sig Dispense Refill Last Dose  . Amino Acids (AMINO ACID PO) Take 1 scoop by mouth daily.   12/22/2015 at Unknown time  . busPIRone (BUSPAR) 10 MG tablet Take 1 tablet (10 mg total) by mouth 3 (three) times daily. 90 tablet 2 Past Week at Unknown time  . clonazePAM (KLONOPIN) 1 MG tablet Take 1 tablet (1 mg total) by mouth 4 (four) times daily. 120 tablet 2 Past Week at Unknown time  . Creatine POWD Take 1 scoop by mouth daily.   12/23/2015 at Unknown time  . DULoxetine (CYMBALTA) 60 MG capsule Take 1 capsule (60 mg total) by mouth daily. 30 capsule 2 12/23/2015 at Unknown time  . Omega-3 Fatty Acids (  FISH OIL PO) Take 3 capsules by mouth daily.   12/22/2015 at Unknown time  . Whey Protein POWD Take 1 scoop by mouth daily.   12/22/2015 at Unknown time    Musculoskeletal: Strength & Muscle Tone: within normal limits Gait & Station: normal Patient leans: normal  Psychiatric Specialty Exam: Physical Exam  Review of Systems  Constitutional: Positive for weight loss.  HENT:       Sinus   Eyes: Negative.   Respiratory: Negative.   Cardiovascular: Negative.   Gastrointestinal: Negative.   Genitourinary: Negative.   Musculoskeletal: Positive for joint pain.  Skin: Negative.   Neurological: Positive for headaches.  Endo/Heme/Allergies: Negative.    Psychiatric/Behavioral: The patient is nervous/anxious and has insomnia.     Blood pressure 130/74, pulse 76, temperature 97.6 F (36.4 C), temperature source Oral, resp. rate 16, height 5' 6.5" (1.689 m), weight 77.565 kg (171 lb), SpO2 100 %.Body mass index is 27.19 kg/(m^2).  General Appearance: Fairly Groomed  Engineer, water::  Fair  Speech:  Clear and Coherent  Volume:  Normal  Mood:  Anxious  Affect:  anxious worried  Thought Process:  Coherent and Goal Directed  Orientation:  Full (Time, Place, and Person)  Thought Content:  symptoms events worries concerns  Suicidal Thoughts:  No  Homicidal Thoughts:  No  Memory:  Immediate;   Fair Recent;   Fair Remote;   Fair  Judgement:  Fair  Insight:  Present  Psychomotor Activity:  Restlessness  Concentration:  Fair  Recall:  [employer] of Knowledge:Fair  Language: Fair  Akathisia:  No  Handed:  Right  AIMS (if indicated):     Assets:  Desire for Improvement Housing Vocational/Educational  ADL's:  Intact  Cognition: WNL  Sleep:  Number of Hours: 3.75     Treatment Plan Summary: Daily contact with patient to assess and evaluate symptoms and progress in treatment and Medication management Supportive approach/coping skills Anxiety; resume the Cymbalta 60 mg daily/will resume the Buspar but will switch to 15 mg BID as will be easier to take BID. With 10 mg TID he is missing some dosages. Will consider increasing the dose of Buspar to 15 mg TID. He has seen benefit from the Buspar Will continue the Klonopin 1 mg BID PRN for the acute anxiety. He states he takes the Klonopin mainly to get to work Use CBT/mindfulness/stress management Observation Level/Precautions:  15 minute checks  Laboratory:  as per the ED  Psychotherapy:  Individual/group  Medications:  Will resume the Cymbalta/Buspar and optimize response  Consultations:    Discharge Concerns:    Estimated LOS: 3-5 days  Other:     I certify that inpatient services  furnished can reasonably be expected to improve the patient's condition.   Charlee Squibb A 1/11/20179:55 AM

## 2015-12-24 NOTE — Plan of Care (Signed)
Problem: Diagnosis: Increased Risk For Suicide Attempt Goal: STG-Patient Will Comply With Medication Regime Outcome: Progressing Patient is compliant      

## 2015-12-24 NOTE — BHH Counselor (Signed)
Adult Comprehensive Assessment  Patient ID: Allen Conley, male   DOB: 1971/05/02, 45 y.o.   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 "That's my number one stressor."  Family Relationships: close with mother Financial / Lack of resources (include bankruptcy): income from [employer]employment/private insurance Housing / Lack of housing: lives in town home alone Physical health (include injuries & life threatening diseases): none identified Social relationships: some friends in community.  Substance abuse: sporatic cocaine use 2x in past 6 mo. tramadol-3x in past 6 months. no other alcohol or substance abuse Bereavement / Loss: recent breakup with gf. Pt reports that breakup is secondary to his job stress.   Living/Environment/Situation:  Living Arrangements: Alone Living conditions (as described by patient or guardian): alone in town home How long has patient lived in current situation?: few years  What is atmosphere in current home: Comfortable  Family History:  Marital status: Single Are you sexually active?: No What is your sexual orientation?: heterosexual Has your sexual activity been affected by drugs, alcohol, medication, or emotional stress?: n/a  Does patient have children?: No  Childhood History:  By whom was/is the patient raised?: Grandparents, Mother Additional childhood history information: father died when he was 6mo old. mother was not in his life often. Pt's grandmother raised him. "good childhood."  Description of patient's relationship with caregiver when they were a child: close to grandmother; strained from mother Patient's description of current relationship with people who raised him/her: close to mother; grandmother died 4 yrs ago How were you disciplined when you got in trouble as a child/adolescent?: n/a  Does  patient have siblings?: Yes Number of Siblings: 2 Description of patient's current relationship with siblings: 2 younger brothers. "I'm close with one and not so much with the other."  Did patient suffer any verbal/emotional/physical/sexual abuse as a child?: No Did patient suffer from severe childhood neglect?: No Has patient ever been sexually abused/assaulted/raped as an adolescent or adult?: No Was the patient ever a victim of a crime or a disaster?: No Witnessed domestic violence?: No Has patient been effected by domestic violence as an adult?: No  Education:  Highest grade of school patient has completed: 3 years college  Currently a Consulting civil engineerstudent?: No Learning disability?: No  Employment/Work Situation:   Employment situation: Employed Where is patient currently employed?: Designer, industrial/productLab Tech at [employer] How long has patient been employed?: one year Patient's job has been impacted by current illness: Yes Describe how patient's job has been impacted: pt reports that he has been working the night shift for past 6months and reports that his mental health has deteriorated since this change What is the longest time patient has a held a job?: few years  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 Tramodal-3x in past 6 mo. no other substance abuse reported.  If attempted suicide, did drugs/alcohol play  a role in this?: No (pt reports increased suicidal ideations due  to job stressors and after breakup with ex gf 2 weeks ago.) Alcohol/Substance Abuse Treatment Hx: Denies past history If yes, describe treatment: n/a  Has alcohol/substance abuse ever caused legal problems?: No  Social Support System:   Conservation officer, nature Support System: Fair Museum/gallery exhibitions officer System: some good friends in community Type of faith/religion: n/a  How does patient's faith help to cope with current illness?: n/a   Leisure/Recreation:   Leisure and Hobbies: computers   Strengths/Needs:   What things does 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   Discharge Plan:   Does patient have access to transportation?: Yes Will patient be returning to same living situation after discharge?: Yes Currently receiving community mental health services: Yes (From Whom) ([place]. Dr. Tenny Craw) If no, would patient like referral for services when discharged?: Yes (What [place] Does patient have financial barriers related to discharge medications?: No  Summary/Recommendations:   Summary and Recommendations (to be completed by the evaluator): Pt is 45 year old [place] [facility] [place] [place] [facility] Pt [facility] due to suicidal ideations, depression, increased anxiety, and for medication stabilization. Pt reports that his primary stressor is working the night shift for the past six months. Pt also reports a recent breakup that is a stressor. Pt plans to return home at d/c and follow-up with Dr. Tenny Craw for medication management/Dr. Kieth Brightly for counseling at [facility] in [place] and would like appts scheduled prior to d/c from [facility]. Pt denies SI/HI/AVH at this time. Pt reports cocaine use 2x in past six months and tramadol use  about 3x in past 6 mo. No other alcohol/substance abuse reported. Recommendations for pt include: crisis stabilization, therapeutic milieu, encourage group attendance and participation, medication management for mood stabilization, and development of comrpehensive mental wellness plan.   Smart, Research scientist (physical sciences). 12/24/2015 3:22 PM

## 2015-12-24 NOTE — BHH Group Notes (Signed)
[facility]   12/24/2015 10:29 AM  Participation Quality:  Appropriate   Mood/Affect:  Appropriate  Depression Rating:  2  Anxiety Rating:  1  Thoughts of Suicide:  No Will you contract for safety?   NA  Current AVH:  No  Plan for Discharge/Comments:  Pt reports that "I"m here to get my medications adjusted." Pt states that [facility] [place] and plans to return. Pt reports good sleep and no withdrawals.   Transportation Means: unknown at this time.   Supports: some family supports identified by pt.   Smart, Allen Beddow LCSW

## 2015-12-24 NOTE — ED Notes (Signed)
[facility]

## 2015-12-24 NOTE — Tx Team (Signed)
Interdisciplinary Treatment Plan Update (Adult)  Date:  12/24/2015  Time Reviewed:  8:42 AM   Progress in Treatment: Attending groups: Yes  Participating in groups:  Yes Taking medication as prescribed:  Yes. Tolerating medication:  Yes. Family/Significant othe contact made:  SPE required for this pt.  Patient understands diagnosis:  Yes. and As evidenced by:  seeking treatment for SI, deperession, cutting behaviors, cocaine/opiate abuse, and for medication stabilization. Discussing patient identified problems/goals with staff:  Yes. Medical problems stabilized or resolved:  Yes. Denies suicidal/homicidal ideation: Yes.  Issues/concerns per patient self-inventory:  Other:  Discharge Plan or Barriers:  Pt sees Dr. Harrington Challenger at [facility] outpatient in [place], [place]. He plans to return home at d/c and resume     Reason for Continuation of Hospitalization: Depression Medication stabilization  Comments:  Allen Conley is an 45 y.o. male who presented voluntarily to [facility] under the behest of his psychiatrist, Dr. Harrington Challenger. Pt is oriented x 4 and presents with pleasant mood. 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 Pt reported that he had SI last week, but had no plan. He denies SI currently. Pt also denied HI/AVH. Pt was not very forthcoming with information, but answered questions when directly asked. Upon inquiry, pt shared that he started using cocaine 6 months ago, and that last week, when he was having SI, he also started to write a suicide note. Per Dr. Alan Ripper note, ptt "cut himself in large slashes on his arms", admitted to taking pain medications to cope with his anxiety, and admitted to calling into work recently due to not being able to get over his anxiety. Diagnosis: Generalized Anxiety D/O  Estimated length of stay:  3-5 days   New goal(s): to develop effective aftercare plan.   Additional  Comments:  Patient and CSW reviewed pt's identified goals and treatment plan. Patient verbalized understanding and agreed to treatment plan. CSW reviewed [facility] "Discharge Process and Patient Involvement" Form. Pt verbalized understanding of information provided and signed form.    Review of initial/current patient goals per problem list:  1. Goal(s): Patient will participate in aftercare plan  Met: Goal progressing   Target date: at discharge  As evidenced by: Patient will participate within aftercare plan AEB aftercare provider and housing plan at discharge being identified.  1/11: Pt sees Dr. Harrington Challenger at [facility] in [place] for med management. CSW assessing.   2. Goal (s): Patient will exhibit decreased depressive symptoms and suicidal ideations.  Met: No.    Target date: at discharge  As evidenced by: Patient will utilize self rating of depression at 3 or below and demonstrate decreased signs of depression or be deemed stable for discharge by MD.  1/11: Pt rates depression as high. Denies SI/HI/AVH.   3. Goal(s): Patient will demonstrate decreased signs and symptoms of anxiety.  Met:No.   Target date: at discharge As evidenced by: Patient will utilize self rating of anxiety at 3 or below and demonstrated decreased signs of anxiety, or be deemed stable for discharge by MD   1/11: Pt rates anxiety as high today.   4. Goal(s): Patient will demonstrate decreased signs of withdrawal due to substance abuse  Met:Yes  Target date:at discharge   As evidenced by: Patient will produce a CIWA/COWS score of 0, have stable vitals signs, and no symptoms of withdrawal.  1/11: Pt reports no signs of withdrawal with CIWA/COWS  of 0 and stable vitals.   Attendees: Patient:   12/24/2015 8:42 AM   Family:   12/24/2015 8:42 AM   Physician:  Dr. Carlton Adam, MD 12/24/2015 8:42 AM   Nursing:   Towanda Malkin RN 12/24/2015 8:42 AM   Clinical Social Worker: Maxie Better, LCSW  12/24/2015 8:42 AM   Clinical Social Worker: Erasmo Downer Drinkard LCSWA; Peri Maris LCSWA 12/24/2015 8:42 AM   Other:  Gerline Legacy Nurse Case Manager 12/24/2015 8:42 AM   Other:  12/24/2015 8:42 AM   Other:   12/24/2015 8:42 AM   Other:  12/24/2015 8:42 AM   Other:  12/24/2015 8:42 AM   Other:  12/24/2015 8:42 AM    12/24/2015 8:42 AM    12/24/2015 8:42 AM    12/24/2015 8:42 AM    12/24/2015 8:42 AM    Scribe for Treatment Team:   Maxie Better, LCSW 12/24/2015 8:42 AM

## 2015-12-24 NOTE — [facility] Group Notes (Signed)
[facility] LCSW Group Therapy  12/24/2015 1:10 PM  Type of Therapy:  Group Therapy  Participation Level:  Minimal  Participation Quality:  Attentive  Affect:  Depressed and Flat  Cognitive:  Lacking  Insight:  Limited  Engagement in Therapy:  Limited  Modes of Intervention:  Confrontation, Discussion, Education, Exploration, Problem-solving, Rapport Building, Socialization and Support  Summary of Progress/Problems: Emotion Regulation: This group focused on both positive and negative emotion identification and allowed group members to process ways to identify feelings, regulate negative emotions, and find healthy ways to manage internal/external emotions. Group members were asked to reflect on a time when their reaction to an emotion led to a negative outcome and explored how alternative responses using emotion regulation would have benefited them. Group members were also asked to discuss a time when emotion regulation was utilized when a negative emotion was experienced. Allen Conley was attentive  during today's processing group. [name] did not actively participate and appeared anxious during group. He actively listened as others shared and appeared receptive to learning about CDIOP from other patients. He is making limited progress in the group setting at this time.   Smart, Allen Kuipers LCSW 12/24/2015, 1:10 PM

## 2015-12-25 DIAGNOSIS — F331 Major depressive disorder, recurrent, moderate: Secondary | ICD-10-CM | POA: Insufficient documentation

## 2015-12-25 MED ORDER — CLONAZEPAM 1 MG PO TABS: ORAL_TABLET | ORAL | Status: DC

## 2015-12-25 MED ORDER — DULOXETINE HCL 60 MG PO CPEP: 60.0000 mg | ORAL_CAPSULE | Freq: Every day | ORAL | Status: DC

## 2015-12-25 MED ORDER — BUSPIRONE HCL 15 MG PO TABS: 15.0000 mg | ORAL_TABLET | Freq: Two times a day (BID) | ORAL | Status: DC

## 2015-12-25 NOTE — Discharge Summary (Addendum)
Physician Discharge Summary Note  Patient:  Allen Conley is an 45 y.o., male MRN:  [id number] DOB:  09-27-1971 Patient phone:  [phone] (home)  Patient address:   [address] [place] [address],  Total Time spent with patient: Greater than 30 minutes  Date of Admission:  12/24/2015  Date of Discharge: 12-25-15  Reason for Admission: Worsening symptoms of depression  Principal Problem: GAD (generalized anxiety disorder)  Discharge Diagnoses: Patient Active Problem List   Diagnosis Date Noted  . Recurrent major depression (HCC) [F33.9] 12/24/2015  . Viral gastroenteritis [A08.4] 12/25/2014  . Bipolar II disorder (HCC) [F31.81] 10/18/2014  . Major depression (HCC) [F32.9] 07/12/2014  . GAD (generalized anxiety disorder) [F41.1] 07/12/2014  . Panic attacks [F41.0] 07/12/2014   Past Psychiatric History: Generalized anxiety disorder, MDD  Past Medical History:  Past Medical History  Diagnosis Date  . Depression     zoloft no help   . Panic attacks   . GAD (generalized anxiety disorder)   . Serotonin syndrome 09/17/2014    on paxil + wellbutrin   History reviewed. No pertinent past surgical history.  Family History:  Family History  Problem Relation Age of Onset  . Cancer Maternal Grandmother   . Cancer Maternal Grandfather   . Depression Mother   . Drug abuse Brother    Family Psychiatric  History: Depression: Mother  Social History:  History  Alcohol Use No    Comment: socially     History  Drug Use No    Social History   Social History  . Marital Status: Single    Spouse Name: N/A  . Number of Children: N/A  . Years of Education: N/A   Social History Main Topics  . Smoking status: Never Smoker   . Smokeless tobacco: Never Used  . Alcohol Use: No     Comment: socially  . Drug Use: No  . Sexual Activity: No   Other Topics Concern  . None   Social History Narrative   Single, no children.   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   No tobacco, occ alcohol (beer).   No hx of alc abuse or drug use.   Exercise: lifts weights 5 d/week, runs Sat/Sun   Hospital Course: 45 Y/O male who states he went to see Dr. Tenny Crawoss for anxiety. States it is really bad. States he has not been taking his medications. Last 7 days he has missed 3-4 times. States he works 12 hours shifts he is already wore out. Might drink acohol once every 2 months, gets a buzz. Recently went through brake up with GF of 3 years. Got depressed suicidal "for one and 1/2 hour." Superficially cut his arm. But he has been able to deal with it. States that more than the depression what gets to him is the anxiety. States that his current work schedule has affected him negatively. He is still keeping up with his weight-lifting body building program planning to be in a competition in couple of months. He does admit to using cocaine but states his is planning to stop it. He states he has not been able to keep up with his follow up appointments due to his work schedule.  Upon his admision to the adult unit, Allen Conley was evaluated & his presenting symptoms identified. His UDS test results showed he was positive for Benzodiazepine & Cocaine. However, he was not presenting with any  substance withdrawal symptoms. He did not receive any detoxification treatments as result, but was started on; Buspar 15 mg for anxiety, Duloxetine 60 mg for depression & was resumed on Klonopin 1 mg for severe anxiety. He was rather enrolled & encouraged to participate in the unit programming, he participated & learned coping skills. He presented no other significant health issues that required treatment or monitoring. Allen Conley was evaluated on daily basis by the clinical providers to assure his response to his treatment regimen.As his treatment progressed, improvement was noted as evidenced by his report of decreasing symptoms, improved sleep, mood, affect, medication  tolerance & active participation in the unit programming.He was encouraged to update his providers on his progress by daily completion of a self inventory assessment noting mood, mental status, pain, any new symptoms, anxiety and or concerns.  Allen Conley's symptoms responded well to his treatment regimen combined with a therapeutic and supportive environment. He was motivated for recovery as evidenced by a positive/appropriate behavior and his interaction with the staff & fellow patients.He also worked closely with the treatment team and case manager to develop a discharge plan with appropriate goals to maintain mood stability after discharge. Coping skills, problem solving as well as relaxation therapies were also part of his unit programming.  Upon his hospital discharge, Allen Conley was in much improved condition than upon admission.His symptoms were reported as significantly decreased or resolved completely. He adamantly denies any SIHI,  AVH, delusional thoughts & or paranoia. He was motivated to continue taking medication with a goal of continued improvement in mental health. He will continue psychiatric care on an outpatient basis as noted below. He is provided with all the necessary information required to make this appointment without problems. He left [facility] with all personal belongings in no apparent distress. Transportation per mother.  Physical Findings: AIMS: Facial and Oral Movements Muscles of Facial Expression: None, normal Lips and Perioral Area: None, normal Jaw: None, normal Tongue: None, normal,Extremity Movements Upper (arms, wrists, hands, fingers): None, normal Lower (legs, knees, ankles, toes): None, normal, Trunk Movements Neck, shoulders, hips: None, normal, Overall Severity Severity of abnormal movements (highest score from questions above): None, normal Incapacitation due to abnormal movements: None, normal Patient's awareness of abnormal movements (rate only patient's report): No  Awareness, Dental Status Current problems with teeth and/or dentures?: No Does patient usually wear dentures?: No  CIWA:  CIWA-Ar Total: 0 COWS:  COWS Total Score: 0  Musculoskeletal: Strength & Muscle Tone: within normal limits Gait & Station: normal Patient leans: N/A  Psychiatric Specialty Exam: Review of Systems  Constitutional: Negative.   HENT: Negative.   Eyes: Negative.   Respiratory: Negative.   Cardiovascular: Negative.   Gastrointestinal: Negative.   Genitourinary: Negative.   Musculoskeletal: Negative.   Skin: Negative.   Neurological: Negative.   Endo/Heme/Allergies: Negative.   Psychiatric/Behavioral: Positive for depression (Stable). Negative for suicidal ideas, hallucinations, memory loss and substance abuse. The patient has insomnia (Stable). The patient is not nervous/anxious.     Blood pressure 108/81, pulse 89, temperature 97.4 F (36.3 C), temperature source Oral, resp. rate 16, height 5' 6.5" (1.689 m), weight 77.565 kg (171 lb), SpO2 100 %.Body mass index is 27.19 kg/(m^2).  See Md's SRA   Have you used any form of tobacco in the last 30 days? (Cigarettes, Smokeless Tobacco, Cigars, and/or Pipes): No  Has this patient used any form of tobacco in the last 30 days? (Cigarettes, Smokeless Tobacco, Cigars, and/or Pipes) No  Metabolic Disorder Labs:  No results  found for: HGBA1C, MPG No results found for: PROLACTIN No results found for: CHOL, TRIG, HDL, CHOLHDL, VLDL, LDLCALC  See Psychiatric Specialty Exam and Suicide Risk Assessment completed by Attending Physician prior to discharge.  Discharge destination:  Home  Is patient on multiple antipsychotic therapies at discharge:  No   Has Patient had three or more failed trials of antipsychotic monotherapy by history:  No  Recommended Plan for Multiple Antipsychotic Therapies: NA    Medication List    STOP taking these medications        AMINO ACID PO     Creatine Powd     FISH OIL PO      Whey Protein Powd      TAKE these medications      Indication   busPIRone 15 MG tablet  Commonly known as:  BUSPAR  Take 1 tablet (15 mg total) by mouth 2 (two) times daily. For anxiety   Indication:  Anxiety     clonazePAM 1 MG tablet  Commonly known as:  KLONOPIN  Take 1 tablet (1 mg) twice daily as needed: For severe anxiety   Indication:  Severe anxiety     DULoxetine 60 MG capsule  Commonly known as:  CYMBALTA  Take 1 capsule (60 mg total) by mouth daily. For depression   Indication:  Major Depressive Disorder       Follow-up Information    Follow up with [facility]  On 01/12/2016.   Why:  Please arrive at 8:45AM for appt with Dr. Ross/medication management. (This is the soonest appt for Dr. Tenny Craw).    Contact information:   [place], [place]  Phone:  Fax:       Follow up with [facility]  On 01/14/2016.   Why:  Appt on this date at 10:45AM for counseling with Dr. Kieth Brightly.    Contact information:   Phone:  Fax:      Follow-up recommendations: Activity:  As tolerated Diet: As recommended by your primary care doctor. Keep all scheduled follow-up appointments as recommended.   Comments: Take all your medications as prescribed by your mental healthcare provider. Report any adverse effects and or reactions from your medicines to your outpatient provider promptly. Patient is instructed and cautioned to not engage in alcohol and or illegal drug use while on prescription medicines. In the event of worsening symptoms, patient is instructed to call the crisis hotline, 911 and or go to the nearest ED for appropriate evaluation and treatment of symptoms. Follow-up with your primary care provider for your other medical issues, concerns and or health care needs.   Signed: Sanjuana Kava, PMHNP, FNP-BC 12/25/2015, 10:13 AM    I personally assessed the patient and formulated the plan Madie Reno A. Dub Mikes, M.D.

## 2015-12-25 NOTE — Tx Team (Signed)
Interdisciplinary Treatment Plan Update (Adult)  Date:  12/25/2015  Time Reviewed:  11:17 AM   Progress in Treatment: Attending groups: Yes  Participating in groups:  Yes Taking medication as prescribed:  Yes. Tolerating medication:  Yes. Family/Significant othe contact made:  SPE completed with pt, as he refused to consent to family contact.  Patient understands diagnosis:  Yes. and As evidenced by:  seeking treatment for SI, deperession, cutting behaviors, cocaine/opiate abuse, and for medication stabilization. Discussing patient identified problems/goals with staff:  Yes. Medical problems stabilized or resolved:  Yes. Denies suicidal/homicidal ideation: Yes.  Issues/concerns per patient self-inventory:  Other:  Discharge Plan or Barriers:  Pt sees Dr. Harrington Challenger at [facility] outpatient in [place], [place]. He asked for follow-up appt with both Dr. Harrington Challenger and Dr. Sima Matas. Appts have been scheduled. Pt's mother will pick her up at d/c.   Reason for Continuation of Hospitalization: none  Comments:  Allen Conley. Allen Conley is an 45 y.o. male who presented voluntarily to [facility] under the behest of his psychiatrist, Dr. Harrington Challenger. Pt is oriented x 4 and presents with pleasant mood. 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 Pt reported that he had SI last week, but had no plan. He denies SI currently. Pt also denied HI/AVH. Pt was not very forthcoming with information, but answered questions when directly asked. Upon inquiry, pt shared that he started using cocaine 6 months ago, and that last week, when he was having SI, he also started to write a suicide note. Per Dr. Alan Ripper note, ptt "cut himself in large slashes on his arms", admitted to taking pain medications to cope with his anxiety, and admitted to calling into work recently due to not being able to get over his anxiety. Diagnosis: Generalized Anxiety  D/O  Estimated length of stay:  D/c today   Additional Comments:  Patient and CSW reviewed pt's identified goals and treatment plan. Patient verbalized understanding and agreed to treatment plan. CSW reviewed [facility] "Discharge Process and Patient Involvement" Form. Pt verbalized understanding of information provided and signed form.    Review of initial/current patient goals per problem list:  1. Goal(s): Patient will participate in aftercare plan  Met:Yes  Target date: at discharge  As evidenced by: Patient will participate within aftercare plan AEB aftercare provider and housing plan at discharge being identified.  1/11: Pt sees Dr. Harrington Challenger at [facility] in [place] for med management. CSW assessing.   1/12: Pt has appts scheduled with both Dr. Harrington Challenger (med management) and Dr. Sima Matas (counseling).   2. Goal (s): Patient will exhibit decreased depressive symptoms and suicidal ideations.  Met: Yes   Target date: at discharge  As evidenced by: Patient will utilize self rating of depression at 3 or below and demonstrate decreased signs of depression or be deemed stable for discharge by MD.  1/11: Pt rates depression as high. Denies SI/HI/AVH.   1/12: Pt rates depression as 2/10 and presents with pleasant mood/calm affect. Denies SI/HI/AVH.   3. Goal(s): Patient will demonstrate decreased signs and symptoms of anxiety.  Met:No.   Target date: at discharge As evidenced by: Patient will utilize self rating of anxiety at 3 or below and demonstrated decreased signs of anxiety, or be deemed stable for discharge by MD   1/11: Pt rates anxiety as high today.    1/12: Pt rates anxiety as 1/10 and presents with pleasant mood/calm affect.  4. Goal(s): Patient will demonstrate decreased signs of withdrawal due to substance abuse  Met:Yes  Target date:at discharge   As evidenced by: Patient will produce a CIWA/COWS score of 0, have stable vitals signs, and no symptoms of  withdrawal.  1/11: Pt reports no signs of withdrawal with CIWA/COWS of 0 and stable vitals.   Attendees: Patient:   12/25/2015 11:17 AM   Family:   12/25/2015 11:17 AM   Physician:  Dr. Carlton Adam, MD 12/25/2015 11:17 AM   Nursing:   Leisa Lenz RN 12/25/2015 11:17 AM   Clinical Social Worker: Maxie Better, LCSW 12/25/2015 11:17 AM   Clinical Social Worker: Erasmo Downer Drinkard LCSWA; Peri Maris LCSWA 12/25/2015 11:17 AM   Other:  Gerline Legacy Nurse Case Manager 12/25/2015 11:17 AM   Other: Agustina Caroli NP 12/25/2015 11:17 AM   Other:   12/25/2015 11:17 AM   Other:  12/25/2015 11:17 AM   Other:  12/25/2015 11:17 AM   Other:  12/25/2015 11:17 AM    12/25/2015 11:17 AM    12/25/2015 11:17 AM    12/25/2015 11:17 AM    12/25/2015 11:17 AM    Scribe for Treatment Team:   Maxie Better, LCSW 12/25/2015 11:17 AM

## 2015-12-25 NOTE — [facility] Suicide Risk Assessment (Signed)
[facility]   Demographic Factors:  Male and Caucasian  Total Time spent with patient: 30 minutes  Musculoskeletal: Strength & Muscle Tone: within normal limits Gait & Station: normal Patient leans: normal  Psychiatric Specialty Exam: Physical Exam  Review of Systems  Constitutional: Negative.   HENT: Negative.   Eyes: Negative.   Respiratory: Negative.   Cardiovascular: Negative.   Gastrointestinal: Negative.   Genitourinary: Negative.   Musculoskeletal: Negative.   Skin: Negative.   Neurological: Negative.   Endo/Heme/Allergies: Negative.   Psychiatric/Behavioral: Positive for depression.    Blood pressure 108/81, pulse 89, temperature 97.4 F (36.3 C), temperature source Oral, resp. rate 16, height 5' 6.5" (1.689 m), weight 77.565 kg (171 lb), SpO2 100 %.Body mass index is 27.19 kg/(m^2).  General Appearance: Fairly Groomed  Patent attorneyye Contact::  Fair  Speech:  Clear and Coherent409  Volume:  Normal  Mood:  Euthymic  Affect:  Appropriate  Thought Process:  Coherent and Goal Directed  Orientation:  Full (Time, Place, and Person)  Thought Content:  plans as he moves on  Suicidal Thoughts:  No  Homicidal Thoughts:  No  Memory:  Immediate;   Fair Recent;   Fair Remote;   Fair  Judgement:  Fair  Insight:  Present and Shallow  Psychomotor Activity:  Normal  Concentration:  Fair  Recall:  FiservFair  Fund of Knowledge:Fair  Language: Fair  Akathisia:  No  Handed:  Right  AIMS (if indicated):     Assets:  Desire for Improvement Housing Vocational/Educational  Sleep:  Number of Hours: 6.75  Cognition: WNL  ADL's:  Intact   Have you used any form of tobacco in the last 30 days? (Cigarettes, Smokeless Tobacco, Cigars, and/or Pipes): No  Has this patient used any form of tobacco in the last 30 days? (Cigarettes, Smokeless Tobacco, Cigars, and/or Pipes) No  Mental Status Per Nursing Assessment::   On Admission:     Current Mental Status by  Physician: IN full contact with reality. There are no active SI plans or intent. He states he is feeling so much better. He is back on medications. He has a clear idea of what he needs to do to get his life back on track. He is going to request a transfer to a day shift. There are two possibilities within the same company. 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  Patients presenting with no risk factors but with morbid ruminations; may be classified as minimal risk based on the severity of the depressive symptoms  Principal Problem: GAD (generalized anxiety disorder) Discharge Diagnoses:  Patient Active Problem List   Diagnosis Date Noted  . Recurrent major depression (HCC) [F33.9] 12/24/2015  . Viral gastroenteritis [A08.4] 12/25/2014  . Bipolar II disorder (HCC) [F31.81] 10/18/2014  . Major depression (HCC) [F32.9] 07/12/2014  . GAD (generalized anxiety disorder) [F41.1] 07/12/2014  . Panic attacks [F41.0] 07/12/2014    Follow-up Information    Follow up with [facility]  On 01/12/2016.   Why:  Please arrive at 8:45AM for appt with Dr. Ross/medication management. (This is the soonest appt for Dr.  Ross).    Contact information:   [address] [place], [place] [address] Phone: [phone] Fax: [phone]      Follow up with [facility]  On 01/14/2016.   Why:  Appt on this date at 10:45AM for counseling with Dr. Kieth Brightly.    Contact information:   [address] [place], [place]  [address] Phone: [phone] Fax: [phone]      Plan Of Care/Follow-up recommendations:  Activity:  as tolerated Diet:  regular Follow up as above Is patient on multiple antipsychotic therapies at discharge:  No   Has Patient had three or more failed trials of antipsychotic monotherapy by history:  No  Recommended Plan for Multiple Antipsychotic Therapies: NA    Madalynne Gutmann A 12/25/2015, 1:04 PM

## 2015-12-25 NOTE — Progress Notes (Signed)
Pt has been observed in the dayroom most of the evening watching TV and talking with peers.  Pt reports his day has been ok. He c/o anxiety and nasal congestion that has bothered him all day.  He has saline spray available and has been using it as ordered.  He was also given prn klonopin at bedtime as requested.  Pt denies SI/HI/AVH.  He makes his needs known to staff.  Discharge plans are in process.  He plans to discharge to home.  Support and encouragement offered.  Safety maintained with q15 minute checks.

## 2015-12-25 NOTE — Progress Notes (Signed)
D"  Patient's self inventory sheet, patient has fair sleep, no sleep medication given.  Good appetite, normal energy level, good concentration.  Rated depression and anxiety 2, denied hopeless.  Denied withdrawals.  Denied SI.  Denied physical problems.  Denied pain.  Goal is to keep focused, positive thoughts, sayings.  Does have discharge plans. A:  Medications administered per MD orders.  Emotional support and encouragement given patient. R:  Denied SI and HI, contracts for safety.  Denied A/V hallucinations.  Safety maintained with 15 minute checks.

## 2015-12-25 NOTE — Progress Notes (Signed)
  [facility] :  Will you be returning to the same living situation after discharge:  Yes,  home At discharge, do you have transportation home?: Yes,  pt's mother Do you have the ability to pay for your medications: Yes,  [employer]-private insurance  Release of information consent forms completed and submitted to medical records by CSW.  Patient to Follow up at: Follow-up Information    Follow up with [facility]  On 01/12/2016.   Why:  Please arrive at 8:45AM for appt with Dr. Ross/medication management. (This is the soonest appt for Dr. Tenny Crawoss).    Contact information:   [address] [place], [place] [address] Phone: [phone] Fax: [phone]      Follow up with [facility]  On 01/14/2016.   Why:  Appt on this date at 10:45AM for counseling with Dr. Kieth Brightlyodenbough.    Contact information:   [address] [place], [place] [address] Phone: [phone] Fax: [phone]      Next level of care provider has access to [facility] Link:yes  Safety Planning and Suicide Prevention discussed: Yes,  SPE completed with pt, as he refused to consent to family contact. SPI pamphlet and mobile crisis information provided to pt.   Have you used any form of tobacco in the last 30 days? (Cigarettes, Smokeless Tobacco, Cigars, and/or Pipes): No  Has patient been referred to the Quitline?: N/A patient is not a smoker  Patient has been referred for addiction treatment: Pt. refused referral-pt does not identify as an addict.   Smart, Corbett Moulder LCSW 12/25/2015, 11:15 AM

## 2015-12-25 NOTE — Progress Notes (Signed)
D: Pt d/c home as ordered. Pt was picked up by his mother at time of D/C. A: D/C instructions reviewed with pt including prescriptions. All belongings in locker 1 returned to pt prior to d/c.  R: Pt denied SI, HI, AVH and pain when assessed. Pt verbalized understanding related to d/c teaching. Signed belonging sheet in agreement with items received. Remained safe on and off unit on Q 15 minutes checks till time of d/c from facility.

## 2015-12-25 NOTE — [facility] Suicide Risk Assessment (Signed)
[facility] INPATIENT:  Family/Significant Other Suicide Prevention Education  Suicide Prevention Education:  Patient Refusal for Family/Significant Other Suicide Prevention Education: The patient Allen Conley has refused to provide written consent for family/significant other to be provided Family/Significant Other Suicide Prevention Education during admission and/or prior to discharge.  Physician notified.  SPE completed with pt, as pt refused to consent to family contact. SPI pamphlet provided to pt and pt was encouraged to share information with support network, ask questions, and talk about any concerns relating to SPE. Pt denies access to guns/firearms and verbalized understanding of information provided. Mobile Crisis information also provided to pt.    Smart, Kelcy Laible LCSW 12/25/2015, 9:11 AM

## 2015-12-25 NOTE — [facility] Group Notes (Addendum)
The focus of this group is to educate the patient on the purpose and policies of crisis stabilization and provide a format to answer questions about their admission.  The group details unit policies and expectations of patients while admitted.  Patient did not attend 0900 nurse education orientation group this morning.  Patient stayed in bed.   

## 2016-01-12 ENCOUNTER — Ambulatory Visit (HOSPITAL_COMMUNITY): Payer: Self-pay | Admitting: Psychiatry

## 2016-01-12 ENCOUNTER — Encounter (HOSPITAL_COMMUNITY): Payer: Self-pay | Admitting: Psychiatry

## 2016-01-13 ENCOUNTER — Ambulatory Visit (INDEPENDENT_AMBULATORY_CARE_PROVIDER_SITE_OTHER): Payer: 59 | Admitting: Psychiatry

## 2016-01-13 ENCOUNTER — Encounter (HOSPITAL_COMMUNITY): Payer: Self-pay | Admitting: Psychiatry

## 2016-01-13 VITALS — BP 109/68 | HR 84 | Ht 66.5 in | Wt 176.4 lb

## 2016-01-13 DIAGNOSIS — F322 Major depressive disorder, single episode, severe without psychotic features: Secondary | ICD-10-CM

## 2016-01-13 DIAGNOSIS — F411 Generalized anxiety disorder: Secondary | ICD-10-CM | POA: Diagnosis not present

## 2016-01-13 MED ORDER — CLONAZEPAM 1 MG PO TABS
1.0000 mg | ORAL_TABLET | Freq: Three times a day (TID) | ORAL | Status: DC | PRN
Start: 2016-01-13 — End: 2016-02-10

## 2016-01-13 MED ORDER — DULOXETINE HCL 60 MG PO CPEP: 60.0000 mg | ORAL_CAPSULE | Freq: Every day | ORAL | Status: DC

## 2016-01-13 MED ORDER — BUSPIRONE HCL 15 MG PO TABS: 15.0000 mg | ORAL_TABLET | Freq: Two times a day (BID) | ORAL | Status: DC

## 2016-01-13 NOTE — Progress Notes (Signed)
Patient ID: Allen Conley, male   DOB: 09-04-1971, 45 y.o.   MRN: [id number] Patient ID: Allen Conley, male   DOB: 1971-03-18, 45 y.o.   MRN: [id number] Patient ID: Allen Conley, male   DOB: [birth year], 45 y.o.   MRN: [id number] Patient ID: Allen Conley, male   DOB: 15-Aug-1971, 45 y.o.   MRN: [id number] Patient ID: Allen Conley, male   DOB: January 11, 1971, 45 y.o.   MRN: [id number] Patient ID: Allen Conley, male   DOB: 01/08/1971, 45 y.o.   MRN: [id number] Patient ID: Allen Conley, male   DOB: July 19, 1971, 45 y.o.   MRN: [id number] Patient ID: Allen Conley, male   DOB: 19-Sep-1971, 45 y.o.   MRN: [id number]  Psychiatric Assessment Adult  [name]. [place] Date of Evaluation:  01/13/2016 Chief Complaint: "I'm feeling a lot better" History of Chief Complaint:   Chief Complaint  Patient presents with  . Depression  . Anxiety  . Follow-up  . Drug Problem    Depression        Past medical history includes anxiety.   Anxiety Symptoms include nervous/anxious behavior.    Drug Problem   this patient is a 45 year old single white male lives alone in [place]. He works for [employer].  The patient was referred by his primary physician, Dr. Regino Schultze, for further assessment and treatment of depression and anxiety.  The patient states that his depression has been building over the last several years. As a baby his mother gave him up to his maternal grandmother and she raised him off his life. When he turned about 64 years old his grandmother's health declined. Between the ages of 67 and 38 he did nothing but go to work school and take care of his grandmother. He claims he "didn't have a life". His grandmother died when he was 45 and this was very difficult for him. He moved in with his mother her husband and his brother but he can get along with them because little lifestyle there was noisy and chaotic.  About a year ago he moved out on his own. He was hoping things would  improve but instead he's gotten increasingly depressed. He was dating a girl for a while and she moved away several months ago and he is been really on the bottom since then. He feels sad all the time, nothing makes him happy, his energy is low he's very anxious and has panic attacks and breakdowns he worries and ruminates about everything constantly He is sleeping and eating well. He denies overt suicidal ideation but feels like it wouldn't matter if his life ended. On the positive side he is very conscious about health and exercise, works out frequently and runs. He does not have any psychotic symptoms. He was drinking a lot until about 3 months ago but is cut it down considerably to 1 or 2 drinks every few weeks. He does not use drugs.  Dr. Diona Browner tried him on Zoloft, Paxil, then a combination of Paxil and Wellbutrin which caused severe tremors. More recently he's been on lithium but couldn't tolerate it. He was on clonazepam which did not help his anxiety and now he is on Ativan. He doesn't seem to help but he waits until he is having a panic attack to take it  The patient returns after 3 weeks. Last time he was here he had been cutting himself and also admitted to using cocaine and alcohol. He seemed dangerous to  self as he had had some recent  suicidal thoughts. [facility] and he stayed 43 days. He states that since he has been out he's been feeling much better primarily because he doesn't have to go to work. 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 Since he's been at home and sleeping during the night and up through the day feels much better. He also has stopped seeing the married woman that he was having an affair with. He realizes that this is going nowhere and led to impulsive self-destructive behavior on his part. He is supposed to go to work tomorrow and I wrote a note strongly suggesting that he get put in a first  shift position. He denies any thoughts of self-harm and his affect is much brighter Review of Systems  Constitutional: Positive for activity change.  Eyes: Negative.   Respiratory: Negative.   Cardiovascular: Negative.   Gastrointestinal: Negative.   Endocrine: Negative.   Genitourinary: Negative.   Musculoskeletal: Negative.   Allergic/Immunologic: Negative.   Neurological: Negative.   Hematological: Negative.   Psychiatric/Behavioral: Positive for depression and dysphoric mood. The patient is nervous/anxious.    Physical Exam not done  Depressive Symptoms: depressed mood, anhedonia, feelings of worthlessness/guilt, difficulty concentrating, hopelessness, anxiety, panic attacks, loss of energy/fatigue,  (Hypo) Manic Symptoms:   Elevated Mood:  No Irritable Mood:  No Grandiosity:  No Distractibility:  No Labiality of Mood:  No Delusions:  No Hallucinations:  No Impulsivity:  No Sexually Inappropriate Behavior:  No Financial Extravagance:  No Flight of Ideas:  No  Anxiety Symptoms: Excessive Worry:  Yes Panic Symptoms:  Yes Agoraphobia:  No Obsessive Compulsive: No  Symptoms: None, Specific Phobias:  No Social Anxiety:  No  Psychotic Symptoms:  Hallucinations: No None Delusions:  No Paranoia:  No   Ideas of Reference:  No  PTSD Symptoms: Ever had a traumatic exposure:  No Had a traumatic exposure in the last month:  No Re-experiencing: No None Hypervigilance:  No Hyperarousal: No None Avoidance: No None  Traumatic Brain Injury: No   Past Psychiatric History: Diagnosis: Depression, anxiety   Hospitalizations: none  Outpatient Care: Only through primary care   Substance Abuse Care: none  Self-Mutilation: none  Suicidal Attempts: none  Violent Behaviors: none   Past Medical History:   Past Medical History  Diagnosis Date  . Depression     zoloft no help   . Panic attacks   . GAD (generalized anxiety disorder)   . Serotonin syndrome 09/17/2014     on paxil + wellbutrin   History of Loss of Consciousness:  No Seizure History:  No Cardiac History:  No Allergies:  No Known Allergies Current Medications:  Current Outpatient Prescriptions  Medication Sig Dispense Refill  . busPIRone (BUSPAR) 15 MG tablet Take 1 tablet (15 mg total) by mouth 2 (two) times daily. For anxiety 60 tablet 2  . clonazePAM (KLONOPIN) 1 MG tablet Take 1 tablet (1 mg total) by mouth 3 (three) times daily as needed for anxiety. 90 tablet 2  . DULoxetine (CYMBALTA) 60 MG capsule Take 1 capsule (60 mg total) by mouth daily. For depression 30 capsule 2   No current facility-administered medications for this visit.    Previous Psychotropic Medications:  Medication Dose   Zoloft, Paxil, Wellbutrin, clonazepam  Substance Abuse History in the last 12 months: Substance Age of 1st Use Last Use Amount Specific Type  Nicotine      Alcohol    drinks very occasionally    Cannabis      Opiates      Cocaine      Methamphetamines      LSD      Ecstasy      Benzodiazepines      Caffeine      Inhalants      Others:                          Medical Consequences of Substance Abuse: none  Legal Consequences of Substance Abuse: none  Family Consequences of Substance Abuse: none  Blackouts:  No DT's:  No Withdrawal Symptoms:  No None  Social History: Current Place of Residence: [address] of Birth: [place] Family Members: Mother, stepfather, 2 brothers Marital Status:  Single Children:none Relationships: Currently not dating 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 Legal History: none Hobbies/Interests: Weightlifting and running  Family History:   Family History  Problem Relation Age of Onset  . Cancer Maternal Grandmother    . Cancer Maternal Grandfather   . Depression Mother   . Drug abuse Brother     Mental Status Examination/Evaluation: Objective:  Appearance: Casual, Neat and Well Groomed   Patent attorney::  Fair  Speech:  Clear and Coherent  Volume:  Normal  Mood:  Good   Affect: Bright   Thought Process:  Goal Directed  Orientation:  Full (Time, Place, and Person)  Thought Content:  Rumination  Suicidal Thoughts: None   Homicidal Thoughts:  No  Judgement:  poor  Insight:poor  Psychomotor Activity: Normal   Akathisia:  No  Handed:  Right  AIMS (if indicated):    Assets:  Communication Skills Desire for Improvement Physical Health Resilience Talents/Skills Vocational/Educational    Laboratory/X-Ray Psychological Evaluation(s)       Assessment:  Axis I: Major Depression, single episode  AXIS I Generalized Anxiety Disorder and Major Depression, single episode  AXIS II Deferred  AXIS III Past Medical History  Diagnosis Date  . Depression     zoloft no help   . Panic attacks   . GAD (generalized anxiety disorder)   . Serotonin syndrome 09/17/2014    on paxil + wellbutrin     AXIS IV other psychosocial or environmental problems  AXIS V 41-50 serious symptoms   Treatment Plan/Recommendations:  Plan of Care: Medication management   Laboratory:    Psychotherapy: He is seeing Sudie Bailey here   Medications: He will continue BuSpar for anxiety, Cymbalta for depression and clonazepam for anxiety   Routine PRN Medications:  No  Consultations:   Safety Concerns:  He denies thoughts of harm to self or others   Other:  He will return in 4 weeks. I have written a letter strongly suggesting he be moved to a first shift position     Diannia Ruder, MD 1/31/20179:30 AM

## 2016-01-14 ENCOUNTER — Ambulatory Visit (HOSPITAL_COMMUNITY): Payer: Self-pay | Admitting: Psychology

## 2016-02-10 ENCOUNTER — Encounter (HOSPITAL_COMMUNITY): Payer: Self-pay | Admitting: Psychiatry

## 2016-02-10 ENCOUNTER — Ambulatory Visit (INDEPENDENT_AMBULATORY_CARE_PROVIDER_SITE_OTHER): Payer: 59 | Admitting: Psychiatry

## 2016-02-10 VITALS — BP 100/55 | HR 68 | Ht 66.5 in | Wt 171.4 lb

## 2016-02-10 DIAGNOSIS — F322 Major depressive disorder, single episode, severe without psychotic features: Secondary | ICD-10-CM

## 2016-02-10 DIAGNOSIS — F411 Generalized anxiety disorder: Secondary | ICD-10-CM | POA: Diagnosis not present

## 2016-02-10 MED ORDER — BUSPIRONE HCL 15 MG PO TABS: 15.0000 mg | ORAL_TABLET | Freq: Two times a day (BID) | ORAL | Status: DC

## 2016-02-10 MED ORDER — CLONAZEPAM 1 MG PO TABS: 1.0000 mg | ORAL_TABLET | Freq: Four times a day (QID) | ORAL | Status: DC

## 2016-02-10 MED ORDER — DULOXETINE HCL 60 MG PO CPEP: 60.0000 mg | ORAL_CAPSULE | Freq: Two times a day (BID) | ORAL | Status: DC

## 2016-02-10 NOTE — Progress Notes (Signed)
Patient ID: Allen Conley, male   DOB: March 29, 1971, 45 y.o.   MRN: [id number] Patient ID: Allen Conley, male   DOB: 07/17/1971, 45 y.o.   MRN: [id number] Patient ID: Allen Conley, male   DOB: 06-Apr-1971, 45 y.o.   MRN: [id number] Patient ID: Allen Conley, male   DOB: [birth year], 45 y.o.   MRN: [id number] Patient ID: Allen Conley, male   DOB: [birth year], 45 y.o.   MRN: [id number] Patient ID: Allen Conley, male   DOB: 1971-01-24, 45 y.o.   MRN: [id number] Patient ID: Allen Conley, male   DOB: 28-Sep-1971, 45 y.o.   MRN: [id number] Patient ID: Allen Conley, male   DOB: 10-Dec-1971, 45 y.o.   MRN: [id number] Patient ID: Allen Conley, male   DOB: 03-30-1971, 45 y.o.   MRN: [id number]  Psychiatric Assessment Adult  [name] Date of Evaluation:  02/10/2016 Chief Complaint: "I'm feeling a lot better" History of Chief Complaint:   Chief Complaint  Patient presents with  . Depression  . Anxiety  . Follow-up    Depression        Past medical history includes anxiety.   Anxiety Symptoms include nervous/anxious behavior.    Drug Problem   this patient is a 45 year old single white male lives alone in [place]. He works for [employer].  The patient was referred by his primary physician, Dr. Regino Schultze, for further assessment and treatment of depression and anxiety.  The patient states that his depression has been building over the last several years. As a baby his mother gave him up to his maternal grandmother and she raised him off his life. When he turned about 69 years old his grandmother's health declined. Between the ages of 39 and 20 he did nothing but go to work school and take care of his grandmother. He claims he "didn't have a life". His grandmother died when he was 75 and this was very difficult for him. He moved in with his mother her husband and his brother but he can get along with them because little lifestyle there was noisy and chaotic.  About a year  ago he moved out on his own. He was hoping things would improve but instead he's gotten increasingly depressed. He was dating a girl for a while and she moved away several months ago and he is been really on the bottom since then. He feels sad all the time, nothing makes him happy, his energy is low he's very anxious and has panic attacks and breakdowns he worries and ruminates about everything constantly He is sleeping and eating well. He denies overt suicidal ideation but feels like it wouldn't matter if his life ended. On the positive side he is very conscious about health and exercise, works out frequently and runs. He does not have any psychotic symptoms. He was drinking a lot until about 3 months ago but is cut it down considerably to 1 or 2 drinks every few weeks. He does not use drugs.  Dr. Diona Browner tried him on Zoloft, Paxil, then a combination of Paxil and Wellbutrin which caused severe tremors. More recently he's been on lithium but couldn't tolerate it. He was on clonazepam which did not help his anxiety and now he is on Ativan. He doesn't seem to help but he waits until he is having a panic attack to take it  The patient returns after 4 weeks. He is doing somewhat better but still is very stressed  working third shift. 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 He thinks he'll feel a lot better if he can sleep at night and rather than trying to sleep during the day. His anxiety is higher than usual and he would like to add one more clonazepam a day. He is also somewhat more depressed and I will increase his Cymbalta. He denies suicidal ideation or any use of drugs or alcohol. He is no longer dating the woman who is married. He is going to the gym and trying to see friends when he can. Review of Systems  Constitutional: Positive for activity change.  Eyes: Negative.   Respiratory: Negative.   Cardiovascular: Negative.   Gastrointestinal: Negative.    Endocrine: Negative.   Genitourinary: Negative.   Musculoskeletal: Negative.   Allergic/Immunologic: Negative.   Neurological: Negative.   Hematological: Negative.   Psychiatric/Behavioral: Positive for depression and dysphoric mood. The patient is nervous/anxious.    Physical Exam not done  Depressive Symptoms: depressed mood, anhedonia, feelings of worthlessness/guilt, difficulty concentrating, hopelessness, anxiety, panic attacks, loss of energy/fatigue,  (Hypo) Manic Symptoms:   Elevated Mood:  No Irritable Mood:  No Grandiosity:  No Distractibility:  No Labiality of Mood:  No Delusions:  No Hallucinations:  No Impulsivity:  No Sexually Inappropriate Behavior:  No Financial Extravagance:  No Flight of Ideas:  No  Anxiety Symptoms: Excessive Worry:  Yes Panic Symptoms:  Yes Agoraphobia:  No Obsessive Compulsive: No  Symptoms: None, Specific Phobias:  No Social Anxiety:  No  Psychotic Symptoms:  Hallucinations: No None Delusions:  No Paranoia:  No   Ideas of Reference:  No  PTSD Symptoms: Ever had a traumatic exposure:  No Had a traumatic exposure in the last month:  No Re-experiencing: No None Hypervigilance:  No Hyperarousal: No None Avoidance: No None  Traumatic Brain Injury: No   Past Psychiatric History: Diagnosis: Depression, anxiety   Hospitalizations: none  Outpatient Care: Only through primary care   Substance Abuse Care: none  Self-Mutilation: none  Suicidal Attempts: none  Violent Behaviors: none   Past Medical History:   Past Medical History  Diagnosis Date  . Depression     zoloft no help   . Panic attacks   . GAD (generalized anxiety disorder)   . Serotonin syndrome 09/17/2014    on paxil + wellbutrin   History of Loss of Consciousness:  No Seizure History:  No Cardiac History:  No Allergies:  No Known Allergies Current Medications:  Current Outpatient Prescriptions  Medication Sig Dispense Refill  . busPIRone  (BUSPAR) 15 MG tablet Take 1 tablet (15 mg total) by mouth 2 (two) times daily. For anxiety 60 tablet 2  . clonazePAM (KLONOPIN) 1 MG tablet Take 1 tablet (1 mg total) by mouth 4 (four) times daily. 120 tablet 2  . DULoxetine (CYMBALTA) 60 MG capsule Take 1 capsule (60 mg total) by mouth 2 (two) times daily. For depression 60 capsule 2   No current facility-administered medications for this visit.    Previous Psychotropic Medications:  Medication Dose   Zoloft, Paxil, Wellbutrin, clonazepam                        Substance Abuse History in the last 12 months: Substance Age of 1st Use Last Use Amount Specific Type  Nicotine      Alcohol    drinks very occasionally    Cannabis  Opiates      Cocaine      Methamphetamines      LSD      Ecstasy      Benzodiazepines      Caffeine      Inhalants      Others:                          Medical Consequences of Substance Abuse: none  Legal Consequences of Substance Abuse: none  Family Consequences of Substance Abuse: none  Blackouts:  No DT's:  No Withdrawal Symptoms:  No None  Social History: Current Place of Residence: [address] of Birth: [place] Family Members: Mother, stepfather, 2 brothers Marital Status:  Single Children:none Relationships: Currently not dating 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 Legal History: none Hobbies/Interests: Weightlifting and running  Family History:   Family History  Problem Relation Age of Onset  . Cancer Maternal Grandmother   . Cancer Maternal Grandfather   . Depression Mother   . Drug abuse Brother     Mental Status Examination/Evaluation: Objective:  Appearance: Casual, Neat and Well Groomed   Patent attorney::  Fair  Speech:  Clear and Coherent  Volume:  Normal   Mood:  Anxious   Affect: A little dysphoric   Thought Process:  Goal Directed  Orientation:  Full (Time, Place, and Person)  Thought Content:  Rumination  Suicidal Thoughts: None   Homicidal Thoughts:  No  Judgement:  Fair   Insight: Fair   Psychomotor Activity: Normal   Akathisia:  No  Handed:  Right  AIMS (if indicated):    Assets:  Communication Skills Desire for Improvement Physical Health Resilience Talents/Skills Vocational/Educational    Laboratory/X-Ray Psychological Evaluation(s)       Assessment:  Axis I: Major Depression, single episode  AXIS I Generalized Anxiety Disorder and Major Depression, single episode  AXIS II Deferred  AXIS III Past Medical History  Diagnosis Date  . Depression     zoloft no help   . Panic attacks   . GAD (generalized anxiety disorder)   . Serotonin syndrome 09/17/2014    on paxil + wellbutrin     AXIS IV other psychosocial or environmental problems  AXIS V 41-50 serious symptoms   Treatment Plan/Recommendations:  Plan of Care: Medication management   Laboratory:    Psychotherapy: He is seeing Sudie Bailey here and will need to reschedule   Medications: He will continue BuSpar for anxiety. Cymbalta for depression will be increased to 60 mg twice a day and clonazepam for anxiety will be increased to 1 mg 4 times a day   Routine PRN Medications:  No  Consultations:   Safety Concerns:  He denies thoughts of harm to self or others   Other:  He will return in 4 weeks.      Diannia Ruder, MD 2/28/201710:54 AM

## 2016-03-09 ENCOUNTER — Ambulatory Visit (INDEPENDENT_AMBULATORY_CARE_PROVIDER_SITE_OTHER): Payer: 59 | Admitting: Psychiatry

## 2016-03-09 ENCOUNTER — Encounter (HOSPITAL_COMMUNITY): Payer: Self-pay | Admitting: Psychiatry

## 2016-03-09 VITALS — BP 115/74 | HR 82 | Ht 66.5 in | Wt 173.2 lb

## 2016-03-09 DIAGNOSIS — F411 Generalized anxiety disorder: Secondary | ICD-10-CM | POA: Diagnosis not present

## 2016-03-09 DIAGNOSIS — F322 Major depressive disorder, single episode, severe without psychotic features: Secondary | ICD-10-CM

## 2016-03-09 NOTE — Progress Notes (Signed)
Patient ID: Allen Conley, male   DOB: [birth year], 45 y.o.   MRN: [id number] Patient ID: Allen Conley, male   DOB: 03-Jan-1971, 45 y.o.   MRN: [id number] Patient ID: Allen Conley, male   DOB: 12/24/1970, 45 y.o.   MRN: [id number] Patient ID: Allen Conley, male   DOB: 11/27/1971, 45 y.o.   MRN: [id number] Patient ID: Allen Conley, male   DOB: Apr 12, 1971, 45 y.o.   MRN: [id number] Patient ID: Allen Conley, male   DOB: 1971/06/02, 45 y.o.   MRN: [id number] Patient ID: Allen Conley, male   DOB: 07-20-1971, 44 y.o.   MRN: [id number] Patient ID: Allen Conley, male   DOB: Mar 15, 1971, 46 y.o.   MRN: [id number] Patient ID: Allen Conley, male   DOB: 1971/11/12, 45 y.o.   MRN: [id number] Patient ID: Allen Conley, male   DOB: [birth year], 45 y.o.   MRN: [id number]  Psychiatric Assessment Adult  [name] Date of Evaluation:  03/09/2016 Chief Complaint: "I'm feeling a lot better" History of Chief Complaint:   Chief Complaint  Patient presents with  . Depression  . Anxiety  . Follow-up    Depression        Past medical history includes anxiety.   Anxiety Symptoms include nervous/anxious behavior.    Drug Problem   this patient is a 45 year old single white male lives alone in [place]. He works for [employer].  The patient was referred by his primary physician, Dr. Regino Schultze, for further assessment and treatment of depression and anxiety.  The patient states that his depression has been building over the last several years. As a baby his mother gave him up to his maternal grandmother and she raised him off his life. When he turned about 34 years old his grandmother's health declined. Between the ages of 33 and 10 he did nothing but go to work school and take care of his grandmother. He claims he "didn't have a life". His grandmother died when he was 70 and this was very difficult for him. He moved in with his mother her husband and his brother but he can get along  with them because little lifestyle there was noisy and chaotic.  About a year ago he moved out on his own. He was hoping things would improve but instead he's gotten increasingly depressed. He was dating a girl for a while and she moved away several months ago and he is been really on the bottom since then. He feels sad all the time, nothing makes him happy, his energy is low he's very anxious and has panic attacks and breakdowns he worries and ruminates about everything constantly He is sleeping and eating well. He denies overt suicidal ideation but feels like it wouldn't matter if his life ended. On the positive side he is very conscious about health and exercise, works out frequently and runs. He does not have any psychotic symptoms. He was drinking a lot until about 3 months ago but is cut it down considerably to 1 or 2 drinks every few weeks. He does not use drugs.  Dr. Diona Browner tried him on Zoloft, Paxil, then a combination of Paxil and Wellbutrin which caused severe tremors. More recently he's been on lithium but couldn't tolerate it. He was on clonazepam which did not help his anxiety and now he is on Ativan. He doesn't seem to help but he waits until he is having a panic attack to take it  The patient returns after 4 weeks. 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 He states that the company still has to interview more people. 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 It is hard for him to sleep during the day and he likes to be out going to the gym and seeing his friends. He is only getting about 6 hours of sleep at a time and sometimes has to make up the difference when he is off. He is taking most of his clonazepam towards the end of his shift and I told him to space it out more so he is not too drowsy on the drive home. His anxiety has improved any  low longer has any thoughts of self-harm. Review of Systems  Constitutional: Positive for activity change.  Eyes: Negative.   Respiratory: Negative.   Cardiovascular: Negative.   Gastrointestinal: Negative.   Endocrine: Negative.   Genitourinary: Negative.   Musculoskeletal: Negative.   Allergic/Immunologic: Negative.   Neurological: Negative.   Hematological: Negative.   Psychiatric/Behavioral: Positive for depression and dysphoric mood. The patient is nervous/anxious.    Physical Exam not done  Depressive Symptoms: depressed mood, anhedonia, feelings of worthlessness/guilt, difficulty concentrating, hopelessness, anxiety, panic attacks, loss of energy/fatigue,  (Hypo) Manic Symptoms:   Elevated Mood:  No Irritable Mood:  No Grandiosity:  No Distractibility:  No Labiality of Mood:  No Delusions:  No Hallucinations:  No Impulsivity:  No Sexually Inappropriate Behavior:  No Financial Extravagance:  No Flight of Ideas:  No  Anxiety Symptoms: Excessive Worry:  Yes Panic Symptoms:  Yes Agoraphobia:  No Obsessive Compulsive: No  Symptoms: None, Specific Phobias:  No Social Anxiety:  No  Psychotic Symptoms:  Hallucinations: No None Delusions:  No Paranoia:  No   Ideas of Reference:  No  PTSD Symptoms: Ever had a traumatic exposure:  No Had a traumatic exposure in the last month:  No Re-experiencing: No None Hypervigilance:  No Hyperarousal: No None Avoidance: No None  Traumatic Brain Injury: No   Past Psychiatric History: Diagnosis: Depression, anxiety   Hospitalizations: none  Outpatient Care: Only through primary care   Substance Abuse Care: none  Self-Mutilation: none  Suicidal Attempts: none  Violent Behaviors: none   Past Medical History:   Past Medical History  Diagnosis Date  . Depression     zoloft no help   . Panic attacks   . GAD (generalized anxiety disorder)   . Serotonin syndrome 09/17/2014    on paxil + wellbutrin   History of  Loss of Consciousness:  No Seizure History:  No Cardiac History:  No Allergies:  No Known Allergies Current Medications:  Current Outpatient Prescriptions  Medication Sig Dispense Refill  . busPIRone (BUSPAR) 15 MG tablet Take 1 tablet (15 mg total) by mouth 2 (two) times daily. For anxiety 60 tablet 2  . clonazePAM (KLONOPIN) 1 MG tablet Take 1 tablet (1 mg total) by mouth 4 (four) times daily. 120 tablet 2  . DULoxetine (CYMBALTA) 60 MG capsule Take 1 capsule (60 mg total) by mouth 2 (two) times daily. For depression 60 capsule 2   No current facility-administered medications for this visit.    Previous Psychotropic Medications:  Medication Dose   Zoloft, Paxil, Wellbutrin, clonazepam  Substance Abuse History in the last 12 months: Substance Age of 1st Use Last Use Amount Specific Type  Nicotine      Alcohol    drinks very occasionally    Cannabis      Opiates      Cocaine      Methamphetamines      LSD      Ecstasy      Benzodiazepines      Caffeine      Inhalants      Others:                          Medical Consequences of Substance Abuse: none  Legal Consequences of Substance Abuse: none  Family Consequences of Substance Abuse: none  Blackouts:  No DT's:  No Withdrawal Symptoms:  No None  Social History: Current Place of Residence: [address] of Birth: [place] Family Members: Mother, stepfather, 2 brothers Marital Status:  Single Children:none Relationships: Currently not dating 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 Legal History: none Hobbies/Interests: Weightlifting and running  Family History:   Family History  Problem Relation Age of Onset  . Cancer Maternal Grandmother   . Cancer Maternal Grandfather   . Depression  Mother   . Drug abuse Brother     Mental Status Examination/Evaluation: Objective:  Appearance: Casual, Neat and Well Groomed   Patent attorney::  Fair  Speech:  Clear and Coherent  Volume:  Normal  Mood:  Anxious   Affect: A little Brighter   Thought Process:  Goal Directed  Orientation:  Full (Time, Place, and Person)  Thought Content:  Rumination  Suicidal Thoughts: None   Homicidal Thoughts:  No  Judgement:  Fair   Insight: Fair   Psychomotor Activity: Normal   Akathisia:  No  Handed:  Right  AIMS (if indicated):    Assets:  Communication Skills Desire for Improvement Physical Health Resilience Talents/Skills Vocational/Educational    Laboratory/X-Ray Psychological Evaluation(s)       Assessment:  Axis I: Major Depression, single episode  AXIS I Generalized Anxiety Disorder and Major Depression, single episode  AXIS II Deferred  AXIS III Past Medical History  Diagnosis Date  . Depression     zoloft no help   . Panic attacks   . GAD (generalized anxiety disorder)   . Serotonin syndrome 09/17/2014    on paxil + wellbutrin     AXIS IV other psychosocial or environmental problems  AXIS V 41-50 serious symptoms   Treatment Plan/Recommendations:  Plan of Care: Medication management   Laboratory:    Psychotherapy: He is seeing Sudie Bailey here and will need to reschedule   Medications: He will continue BuSpar for anxiety. Cymbalta for depression will be Continued at 60 mg twice a day and clonazepam for anxiety will be continued  1 mg 4 times a day   Routine PRN Medications:  No  Consultations:   Safety Concerns:  He denies thoughts of harm to self or others   Other:  He will return in 6 weeks.      Diannia Ruder, MD 3/28/20172:03 PM

## 2016-04-20 ENCOUNTER — Ambulatory Visit (HOSPITAL_COMMUNITY): Payer: Self-pay | Admitting: Psychiatry

## 2016-04-20 ENCOUNTER — Encounter (HOSPITAL_COMMUNITY): Payer: Self-pay | Admitting: Psychiatry

## 2016-05-05 ENCOUNTER — Telehealth (HOSPITAL_COMMUNITY): Payer: Self-pay | Admitting: *Deleted

## 2016-05-05 NOTE — Telephone Encounter (Signed)
We will give him one more time

## 2016-05-05 NOTE — Telephone Encounter (Signed)
Pt called asking when is his next appt. Informed pt that he does not have any scheduled appt. Also informed pt that office will have to call him back due to No show Policy and pt agreed. Pt no showed on 04-20-2016, 01-12-2016, 12-10-2015, 10-02-2015 and 08-31-2016. Please advice.

## 2016-05-06 NOTE — Telephone Encounter (Signed)
Tried calling pt number on file and could not leave message due to pt not having a voicemail that is set up.

## 2016-05-06 NOTE — Telephone Encounter (Signed)
Called pt to sch appt and could not leave message due to pt voicemail box not set up.

## 2016-05-07 ENCOUNTER — Ambulatory Visit (INDEPENDENT_AMBULATORY_CARE_PROVIDER_SITE_OTHER): Payer: 59 | Admitting: Psychiatry

## 2016-05-07 ENCOUNTER — Encounter (HOSPITAL_COMMUNITY): Payer: Self-pay | Admitting: Psychiatry

## 2016-05-07 VITALS — BP 118/82 | Ht 66.0 in | Wt 169.0 lb

## 2016-05-07 DIAGNOSIS — F411 Generalized anxiety disorder: Secondary | ICD-10-CM

## 2016-05-07 DIAGNOSIS — F322 Major depressive disorder, single episode, severe without psychotic features: Secondary | ICD-10-CM

## 2016-05-07 MED ORDER — BUSPIRONE HCL 30 MG PO TABS: 30.0000 mg | ORAL_TABLET | Freq: Two times a day (BID) | ORAL | Status: DC

## 2016-05-07 MED ORDER — DULOXETINE HCL 60 MG PO CPEP: 60.0000 mg | ORAL_CAPSULE | Freq: Two times a day (BID) | ORAL | Status: DC

## 2016-05-07 MED ORDER — CLONAZEPAM 1 MG PO TABS: 1.0000 mg | ORAL_TABLET | Freq: Four times a day (QID) | ORAL | Status: DC

## 2016-05-07 NOTE — Telephone Encounter (Signed)
Pt is aware of no show policy and resch appt

## 2016-05-07 NOTE — Progress Notes (Signed)
Patient ID: Allen JuneAlex N. Ephriam Conley, male   DOB: 05-08-1971, 45 y.o.   MRN: [id number] Patient ID: Allen Conley, male   DOB: 05-08-1971, 45 y.o.   MRN: [id number] Patient ID: Allen Conley, male   DOB: 05-08-1971, 45 y.o.   MRN: [id number] Patient ID: Allen Conley, male   DOB: 05-08-1971, 45 y.o.   MRN: [id number] Patient ID: Allen Conley, male   DOB: 05-08-1971, 45 y.o.   MRN: [id number] Patient ID: Allen Conley, male   DOB: 05-08-1971, 45 y.o.   MRN: [id number] Patient ID: Allen Conley, male   DOB: 05-08-1971, 45 y.o.   MRN: [id number] Patient ID: Allen Conley, male   DOB: 05-08-1971, 45 y.o.   MRN: [id number] Patient ID: Allen Conley, male   DOB: 05-08-1971, 45 y.o.   MRN: [id number] Patient ID: Allen Conley, male   DOB: 05-08-1971, 45 y.o.   MRN: [id number] Patient ID: Allen Conley, male   DOB: 05-08-1971, 45 y.o.   MRN: [id number]  Psychiatric Assessment Adult  Patient Identification:  Allen Junelex N. Lawes Date of Evaluation:  05/07/2016 Chief Complaint: "I'm feeling a lot better" History of Chief Complaint:   Chief Complaint  Patient presents with  . Depression  . Anxiety  . Follow-up    Depression        Past medical history includes anxiety.   Anxiety Symptoms include nervous/anxious behavior.    Drug Problem   this patient is a 45 year old single white male lives alone in [place]. He works for [employer].  The patient was referred by his primary physician, Dr. Regino SchultzeMcGough, for further assessment and treatment of depression and anxiety.  The patient states that his depression has been building over the last several years. As a baby his mother gave him up to his maternal grandmother and she raised him off his life. When he turned about 45 years old his grandmother's health declined. Between the ages of [age] and [age] he did nothing but go to work school and take care of his grandmother. He claims he "didn't have a life". His grandmother died when he was [age] and this was very difficult for him.  He moved in with his mother her husband and his brother but he can get along with them because little lifestyle there was noisy and chaotic.  About a year ago he moved out on his own. He was hoping things would improve but instead he's gotten increasingly depressed. He was dating a girl for a while and she moved away several months ago and he is been really on the bottom since then. He feels sad all the time, nothing makes him happy, his energy is low he's very anxious and has panic attacks and breakdowns he worries and ruminates about everything constantly He is sleeping and eating well. He denies overt suicidal ideation but feels like it wouldn't matter if his life ended. On the positive side he is very conscious about health and exercise, works out frequently and runs. He does not have any psychotic symptoms. He was drinking a lot until about 3 months ago but is cut it down considerably to 1 or 2 drinks every few weeks. He does not use drugs.  Dr. Diona BrownerMcDowell tried him on Zoloft, Paxil, then a combination of Paxil and Wellbutrin which caused severe tremors. More recently he's been on lithium but couldn't tolerate it. He was on clonazepam which did not help his anxiety and now he is on Ativan. He doesn't seem  to help but he waits until he is having a panic attack to take it  The patient returns after 2 months. He has missed some appointments. 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 He is still working nights and is bored with his current job. Often he chooses to do things with friends or go the gym during the day instead of sleep. He's having a good deal of trouble with memory and concentration probably due to sleep deprivation. He is more anxious but claims he is not using more than about 2 Klonopin most days. We discussed increasing his BuSpar and he is agreeable. I also strongly urged him to come back to counseling with Dr. Shelva Majestic to work on stress management. He denies suicidal  ideation Review of Systems  Constitutional: Positive for activity change.  Eyes: Negative.   Respiratory: Negative.   Cardiovascular: Negative.   Gastrointestinal: Negative.   Endocrine: Negative.   Genitourinary: Negative.   Musculoskeletal: Negative.   Allergic/Immunologic: Negative.   Neurological: Negative.   Hematological: Negative.   Psychiatric/Behavioral: Positive for depression and dysphoric mood. The patient is nervous/anxious.    Physical Exam not done  Depressive Symptoms: depressed mood, anhedonia, feelings of worthlessness/guilt, difficulty concentrating, hopelessness, anxiety, panic attacks, loss of energy/fatigue,  (Hypo) Manic Symptoms:   Elevated Mood:  No Irritable Mood:  No Grandiosity:  No Distractibility:  No Labiality of Mood:  No Delusions:  No Hallucinations:  No Impulsivity:  No Sexually Inappropriate Behavior:  No Financial Extravagance:  No Flight of Ideas:  No  Anxiety Symptoms: Excessive Worry:  Yes Panic Symptoms:  Yes Agoraphobia:  No Obsessive Compulsive: No  Symptoms: None, Specific Phobias:  No Social Anxiety:  No  Psychotic Symptoms:  Hallucinations: No None Delusions:  No Paranoia:  No   Ideas of Reference:  No  PTSD Symptoms: Ever had a traumatic exposure:  No Had a traumatic exposure in the last month:  No Re-experiencing: No None Hypervigilance:  No Hyperarousal: No None Avoidance: No None  Traumatic Brain Injury: No   Past Psychiatric History: Diagnosis: Depression, anxiety   Hospitalizations: none  Outpatient Care: Only through primary care   Substance Abuse Care: none  Self-Mutilation: none  Suicidal Attempts: none  Violent Behaviors: none   Past Medical History:   Past Medical History  Diagnosis Date  . Depression     zoloft no help   . Panic attacks   . GAD (generalized anxiety disorder)   . Serotonin syndrome 09/17/2014    on paxil + wellbutrin   History of Loss of Consciousness:   No Seizure History:  No Cardiac History:  No Allergies:  No Known Allergies Current Medications:  Current Outpatient Prescriptions  Medication Sig Dispense Refill  . busPIRone (BUSPAR) 30 MG tablet Take 1 tablet (30 mg total) by mouth 2 (two) times daily. 60 tablet 2  . clonazePAM (KLONOPIN) 1 MG tablet Take 1 tablet (1 mg total) by mouth 4 (four) times daily. 120 tablet 2  . DULoxetine (CYMBALTA) 60 MG capsule Take 1 capsule (60 mg total) by mouth 2 (two) times daily. For depression 60 capsule 2   No current facility-administered medications for this visit.    Previous Psychotropic Medications:  Medication Dose   Zoloft, Paxil, Wellbutrin, clonazepam                        Substance Abuse History in the last 12 months: Substance Age of 1st  Use Last Use Amount Specific Type  Nicotine      Alcohol    drinks very occasionally    Cannabis      Opiates      Cocaine      Methamphetamines      LSD      Ecstasy      Benzodiazepines      Caffeine      Inhalants      Others:                          Medical Consequences of Substance Abuse: none  Legal Consequences of Substance Abuse: none  Family Consequences of Substance Abuse: none  Blackouts:  No DT's:  No Withdrawal Symptoms:  No None  Social History: Current Place of Residence: [address] of Birth: [place] Family Members: Mother, stepfather, 2 brothers Marital Status:  Single Children:none Relationships: Currently not dating 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 Legal History: none Hobbies/Interests: Weightlifting and running  Family History:   Family History  Problem Relation Age of Onset  . Cancer Maternal Grandmother   . Cancer Maternal Grandfather   . Depression Mother   . Drug abuse Brother      Mental Status Examination/Evaluation: Objective:  Appearance: Casual, Neat and Well Groomed   Patent attorney::  Fair  Speech:  Clear and Coherent  Volume:  Normal  Mood:  Anxious   Affect:Congruent   Thought Process:  Goal Directed  Orientation:  Full (Time, Place, and Person)  Thought Content:  Rumination  Suicidal Thoughts: None   Homicidal Thoughts:  No  Judgement:  Fair   Insight: Fair   Psychomotor Activity: Normal   Akathisia:  No  Handed:  Right  AIMS (if indicated):    Assets:  Communication Skills Desire for Improvement Physical Health Resilience Talents/Skills Vocational/Educational    Laboratory/X-Ray Psychological Evaluation(s)       Assessment:  Axis I: Major Depression, single episode  AXIS I Generalized Anxiety Disorder and Major Depression, single episode  AXIS II Deferred  AXIS III Past Medical History  Diagnosis Date  . Depression     zoloft no help   . Panic attacks   . GAD (generalized anxiety disorder)   . Serotonin syndrome 09/17/2014    on paxil + wellbutrin     AXIS IV other psychosocial or environmental problems  AXIS V 41-50 serious symptoms   Treatment Plan/Recommendations:  Plan of Care: Medication management   Laboratory:    Psychotherapy: He is seeing Sudie Bailey here and will need to reschedule   Medications: He will continue BuSpar for anxietyBut increase the dose to 30 g twice a day Cymbalta for depression will be Continued at 60 mg twice a day and clonazepam for anxiety will be continued  1 mg 4 times a day as needed   Routine PRN Medications:  No  Consultations:   Safety Concerns:  He denies thoughts of harm to self or others   Other:  He will return in 2 months     Diannia Ruder, MD 5/26/201710:49 AM

## 2016-05-27 ENCOUNTER — Ambulatory Visit: Payer: 59 | Admitting: Family Medicine

## 2016-05-27 DIAGNOSIS — Z0289 Encounter for other administrative examinations: Secondary | ICD-10-CM

## 2016-05-31 ENCOUNTER — Telehealth (HOSPITAL_COMMUNITY): Payer: Self-pay | Admitting: *Deleted

## 2016-05-31 NOTE — Telephone Encounter (Signed)
Office received fax from [facility] for 90 days supply for pt Duloxetine HCL DR 60 mg and Buspirone HCL 30 mg. Per pt, it's cheaper for him to get it with the mail order then local pharmacy. Both medication were last filled 05-07-2016 with only 30 days supply.

## 2016-05-31 NOTE — Telephone Encounter (Signed)
Office received fax from [facility] for 90 days supply for pt Clonazeopam Tab 1 mg. Per pt chart, pt medication was last printed on 05-07-2016 for 30 days supply with 2 refills. Office called pt to get more details because per pt chart, his pharmacy on file is [facility]. Per pt, his Cymbalta went from $11 to 125 per month and he can not afford that. Per pt, his pharmacy gived him [facility] number to call and when he called them, they informed him that if he gets 90 days supply, his medications are free of charge. Per pt, his printed script that was given to him on 05-07-2016, he gived [facility] to [facility] and he is fine with getting his Buspar and Klonopin from [facility] until 07-07-2016 when he comes into office for f/u with Dr. Tenny Crawoss. Per pt, he just need samples for Cymbalta and office informed him that office do not have samples for Cymbalta and pt verbalized understanding. Per pt, it is okay for office to hold off on responding to [facility] request for 90 days supply for his Klonopin and he will take care of it when he comes in on 07-07-2016.

## 2016-06-01 ENCOUNTER — Ambulatory Visit (HOSPITAL_COMMUNITY): Payer: Self-pay | Admitting: Psychology

## 2016-06-01 ENCOUNTER — Telehealth (HOSPITAL_COMMUNITY): Payer: Self-pay | Admitting: *Deleted

## 2016-06-01 ENCOUNTER — Other Ambulatory Visit (HOSPITAL_COMMUNITY): Payer: Self-pay | Admitting: Psychiatry

## 2016-06-01 MED ORDER — DULOXETINE HCL 60 MG PO CPEP: 60.0000 mg | ORAL_CAPSULE | Freq: Two times a day (BID) | ORAL | Status: DC

## 2016-06-01 MED ORDER — BUSPIRONE HCL 30 MG PO TABS: 30.0000 mg | ORAL_TABLET | Freq: Two times a day (BID) | ORAL | Status: DC

## 2016-06-01 NOTE — Telephone Encounter (Signed)
sent 

## 2016-06-01 NOTE — Telephone Encounter (Signed)
noted 

## 2016-06-01 NOTE — Telephone Encounter (Signed)
Called pt and resch appt with Dr. Kieth Brightlyodenbough and reminded pt of no show policy and pt verbalized understanding.

## 2016-06-01 NOTE — Telephone Encounter (Signed)
Per notes on file, on 05-05-16 pt was reminded of the no show policy and message was sent to provider. Pt had an appt with Dr. Kieth Brightlyodenbough today 06-01-16 at 1:00 pm and pt no showed. After appt time, pt called office stating "he knows that if he missed one more appt he would be dropped from office." Per pt, he would like to know what to do now. Pt no showed on 06-01-16 with Dr. Kieth Brightlyodenbough, 04-20-16 with Dr. Tenny Crawoss, 01-12-16 with Dr. Tenny Crawoss, 12-10-15 with Dr. Tenny Crawoss, 10-02-15 with Dr. Tenny Crawoss and 09-01-15 with Dr. Tenny Crawoss. Pt number is [phone]. Please advise.

## 2016-06-01 NOTE — Telephone Encounter (Signed)
He has been coming to see me lately

## 2016-07-07 ENCOUNTER — Encounter (HOSPITAL_COMMUNITY): Payer: Self-pay | Admitting: Psychiatry

## 2016-07-07 ENCOUNTER — Ambulatory Visit (INDEPENDENT_AMBULATORY_CARE_PROVIDER_SITE_OTHER): Payer: 59 | Admitting: Psychiatry

## 2016-07-07 VITALS — BP 103/70 | HR 77 | Ht 66.0 in | Wt 172.8 lb

## 2016-07-07 DIAGNOSIS — F322 Major depressive disorder, single episode, severe without psychotic features: Secondary | ICD-10-CM | POA: Diagnosis not present

## 2016-07-07 DIAGNOSIS — F411 Generalized anxiety disorder: Secondary | ICD-10-CM

## 2016-07-07 MED ORDER — ALPRAZOLAM 1 MG PO TABS: 1.0000 mg | ORAL_TABLET | Freq: Four times a day (QID) | ORAL | 2 refills | Status: DC

## 2016-07-07 MED ORDER — DULOXETINE HCL 60 MG PO CPEP: 60.0000 mg | ORAL_CAPSULE | Freq: Two times a day (BID) | ORAL | 2 refills | Status: DC

## 2016-07-07 MED ORDER — BUSPIRONE HCL 30 MG PO TABS: 30.0000 mg | ORAL_TABLET | Freq: Two times a day (BID) | ORAL | 2 refills | Status: DC
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
## 2016-07-09 ENCOUNTER — Encounter (HOSPITAL_COMMUNITY): Payer: Self-pay | Admitting: Psychology

## 2016-07-09 ENCOUNTER — Ambulatory Visit (INDEPENDENT_AMBULATORY_CARE_PROVIDER_SITE_OTHER): Payer: 59 | Admitting: Psychology

## 2016-07-09 DIAGNOSIS — F411 Generalized anxiety disorder: Secondary | ICD-10-CM

## 2016-07-09 DIAGNOSIS — F4001 Agoraphobia with panic disorder: Secondary | ICD-10-CM

## 2016-07-09 DIAGNOSIS — F322 Major depressive disorder, single episode, severe without psychotic features: Secondary | ICD-10-CM
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
## 2016-07-29 ENCOUNTER — Encounter (HOSPITAL_COMMUNITY): Payer: Self-pay | Admitting: Psychiatry

## 2016-07-29 ENCOUNTER — Ambulatory Visit (INDEPENDENT_AMBULATORY_CARE_PROVIDER_SITE_OTHER): Payer: 59 | Admitting: Psychiatry

## 2016-07-29 VITALS — BP 139/93 | HR 95 | Ht 66.0 in | Wt 165.0 lb

## 2016-07-29 DIAGNOSIS — F322 Major depressive disorder, single episode, severe without psychotic features: Secondary | ICD-10-CM

## 2016-07-29 MED ORDER — FLUOXETINE HCL 20 MG PO CAPS: 20.0000 mg | ORAL_CAPSULE | Freq: Two times a day (BID) | ORAL | 2 refills | Status: DC

## 2016-07-29 NOTE — Progress Notes (Signed)
Patient ID: Allen Conley, male   DOB: 19-Jan-1971, 45 y.o.   MRN: [id number] Patient ID: Allen Junelex N. Ephriam Conley, male   DOB: 19-Jan-1971, 45 y.o.   MRN: [id number] Patient ID: Allen Junelex N. Ephriam Conley, male   DOB: 19-Jan-1971, 45 y.o.   MRN: [id number] Patient ID: Allen Junelex N. Ephriam Conley, male   DOB: 19-Jan-1971, 45 y.o.   MRN: [id number] Patient ID: Allen Junelex N. Ephriam Conley, male   DOB: 19-Jan-1971, 45 y.o.   MRN: [id number] Patient ID: Allen Junelex N. Ephriam Conley, male   DOB: 19-Jan-1971, 45 y.o.   MRN: [id number] Patient ID: Allen Junelex N. Ephriam Conley, male   DOB: 19-Jan-1971, 45 y.o.   MRN: [id number] Patient ID: Allen Junelex N. Ephriam Conley, male   DOB: 19-Jan-1971, 45 y.o.   MRN: [id number] Patient ID: Allen Junelex N. Ephriam Conley, male   DOB: 19-Jan-1971, 45 y.o.   MRN: [id number] Patient ID: Allen Junelex N. Ephriam Conley, male   DOB: 19-Jan-1971, 45 y.o.   MRN: [id number] Patient ID: Allen Junelex N. Ephriam Conley, male   DOB: 19-Jan-1971, 45 y.o.   MRN: [id number]  Psychiatric Assessment Adult  Patient Identification:  Allen Junelex N. Bracy Date of Evaluation:  07/29/2016 Chief Complaint: "I'm feeling a lot better" History of Chief Complaint:   Chief Complaint  Patient presents with  . Depression  . Anxiety  . Follow-up    Anxiety  Symptoms include nervous/anxious behavior.    Depression         Past medical history includes anxiety.   Drug Problem    this patient is a 45 year old single white male lives alone in [place]. He works for [employer].  The patient was referred by his primary physician, Dr. Regino SchultzeMcGough, for further assessment and treatment of depression and anxiety.  The patient states that his depression has been building over the last several years. As a baby his mother gave him up to his maternal grandmother and she raised him off his life. When he turned about 45 years old his grandmother's health declined. Between the ages of [age] and [age] he did nothing but go to work school and take care of his grandmother. He claims he "didn't have a life". His grandmother died when he was [age] and this was very difficult for  him. He moved in with his mother her husband and his brother but he can get along with them because little lifestyle there was noisy and chaotic.  About a year ago he moved out on his own. He was hoping things would improve but instead he's gotten increasingly depressed. He was dating a girl for a while and she moved away several months ago and he is been really on the bottom since then. He feels sad all the time, nothing makes him happy, his energy is low he's very anxious and has panic attacks and breakdowns he worries and ruminates about everything constantly He is sleeping and eating well. He denies overt suicidal ideation but feels like it wouldn't matter if his life ended. On the positive side he is very conscious about health and exercise, works out frequently and runs. He does not have any psychotic symptoms. He was drinking a lot until about 3 months ago but is cut it down considerably to 1 or 2 drinks every few weeks. He does not use drugs.  Dr. Diona BrownerMcDowell tried him on Zoloft, Paxil, then a combination of Paxil and Wellbutrin which caused severe tremors. More recently he's been on lithium but couldn't tolerate it. He was on clonazepam which did not help his anxiety and now he is on Ativan.  He doesn't seem to help but he waits until he is having a panic attack to take it  The patient returns after 3 weeks. He still not doing well and constantly obsesses about the girl he was dating and why she left him. He spent the last 4 days laying in bed and only gets up to go to work. He feels like he can't go any further in his life but denies suicidal ideation. He has been here to see his counselor told him the same thing-that he needs to get out with other people and try to get on a schedule. He's not taking the Xanax and BuSpar as prescribed and I told him he needs to do this. I also suggested he switch to Prozac since it is tried and true drug for OCD  Review of Systems  Constitutional: Positive for activity  change.  Eyes: Negative.   Respiratory: Negative.   Cardiovascular: Negative.   Gastrointestinal: Negative.   Endocrine: Negative.   Genitourinary: Negative.   Musculoskeletal: Negative.   Allergic/Immunologic: Negative.   Neurological: Negative.   Hematological: Negative.   Psychiatric/Behavioral: Positive for depression and dysphoric mood. The patient is nervous/anxious.    Physical Exam not done  Depressive Symptoms: depressed mood, anhedonia, feelings of worthlessness/guilt, difficulty concentrating, hopelessness, anxiety, panic attacks, loss of energy/fatigue,  (Hypo) Manic Symptoms:   Elevated Mood:  No Irritable Mood:  No Grandiosity:  No Distractibility:  No Labiality of Mood:  No Delusions:  No Hallucinations:  No Impulsivity:  No Sexually Inappropriate Behavior:  No Financial Extravagance:  No Flight of Ideas:  No  Anxiety Symptoms: Excessive Worry:  Yes Panic Symptoms:  Yes Agoraphobia:  No Obsessive Compulsive: No  Symptoms: None, Specific Phobias:  No Social Anxiety:  No  Psychotic Symptoms:  Hallucinations: No None Delusions:  No Paranoia:  No   Ideas of Reference:  No  PTSD Symptoms: Ever had a traumatic exposure:  No Had a traumatic exposure in the last month:  No Re-experiencing: No None Hypervigilance:  No Hyperarousal: No None Avoidance: No None  Traumatic Brain Injury: No   Past Psychiatric History: Diagnosis: Depression, anxiety   Hospitalizations: none  Outpatient Care: Only through primary care   Substance Abuse Care: none  Self-Mutilation: none  Suicidal Attempts: none  Violent Behaviors: none   Past Medical History:   Past Medical History:  Diagnosis Date  . Depression    zoloft no help   . GAD (generalized anxiety disorder)   . Panic attacks   . Serotonin syndrome 09/17/2014   on paxil + wellbutrin   History of Loss of Consciousness:  No Seizure History:  No Cardiac History:  No Allergies:  No Known  Allergies Current Medications:  Current Outpatient Prescriptions  Medication Sig Dispense Refill  . ALPRAZolam (XANAX) 1 MG tablet Take 1 tablet (1 mg total) by mouth 4 (four) times daily. 120 tablet 2  . busPIRone (BUSPAR) 30 MG tablet Take 1 tablet (30 mg total) by mouth 2 (two) times daily. 180 tablet 2  . FLUoxetine (PROZAC) 20 MG capsule Take 1 capsule (20 mg total) by mouth 2 (two) times daily. 60 capsule 2   No current facility-administered medications for this visit.     Previous Psychotropic Medications:  Medication Dose   Zoloft, Paxil, Wellbutrin, clonazepam                        Substance Abuse History in the last 12 months: Substance Age of  1st Use Last Use Amount Specific Type  Nicotine      Alcohol    drinks very occasionally    Cannabis      Opiates      Cocaine      Methamphetamines      LSD      Ecstasy      Benzodiazepines      Caffeine      Inhalants      Others:                          Medical Consequences of Substance Abuse: none  Legal Consequences of Substance Abuse: none  Family Consequences of Substance Abuse: none  Blackouts:  No DT's:  No Withdrawal Symptoms:  No None  Social History: Current Place of Residence: [address] [place] Place of Birth: [place] Family Members: Mother, stepfather, 2 brothers Marital Status:  Single Children:none Relationships: Currently not dating Education: 2 year degree in Environmental managerinformation technology Educational Problems/Performance: Religious Beliefs/Practices: Unknown History of Abuse: none Electrical engineerccupational Experiences inventory clerk Military History:  None. Legal History: none Hobbies/Interests: Weightlifting and running  Family History:   Family History  Problem Relation Age of Onset  . Cancer Maternal Grandmother   . Cancer Maternal Grandfather   . Depression Mother   . Drug abuse Brother     Mental Status Examination/Evaluation: Objective:  Appearance: Casual, Neat and Well  Groomed   Patent attorneyye Contact::  Fair  Speech:  Clear and Coherent  Volume:  Normal  Mood:  Anxious   Affect:Dysphoric   Thought Process:  Goal Directed  Orientation:  Full (Time, Place, and Person)  Thought Content:  Rumination  Suicidal Thoughts: None   Homicidal Thoughts:  No  Judgement:  Fair   Insight: Fair   Psychomotor Activity: Normal   Akathisia:  No  Handed:  Right  AIMS (if indicated):    Assets:  Communication Skills Desire for Improvement Physical Health Resilience Talents/Skills Vocational/Educational    Laboratory/X-Ray Psychological Evaluation(s)       Assessment:  Axis I: Major Depression, single episode  AXIS I Generalized Anxiety Disorder and Major Depression, single episode  AXIS II Deferred  AXIS III Past Medical History:  Diagnosis Date  . Depression    zoloft no help   . GAD (generalized anxiety disorder)   . Panic attacks   . Serotonin syndrome 09/17/2014   on paxil + wellbutrin     AXIS IV other psychosocial or environmental problems  AXIS V 41-50 serious symptoms   Treatment Plan/Recommendations:  Plan of Care: Medication management   Laboratory:    Psychotherapy: He is seeing Sudie BaileyJohn Rodenbaugh here  Medications: He will Continue BuSpar for anxiety at 30 g twice a day  and Xanax  1 mg 4 times a day. Will discontinue Cymbalta and start Prozac 40 mg daily   Routine PRN Medications:  No  Consultations:   Safety Concerns:  He denies thoughts of harm to self or others   Other:  He will return in 4 weeks     Diannia RuderOSS, Kalena Mander, MD 8/17/201711:47 AM    Patient ID: Allen Conley, male   DOB: [birth year], 45 y.o.   MRN: [id number]

## 2016-08-02 ENCOUNTER — Telehealth (HOSPITAL_COMMUNITY): Payer: Self-pay | Admitting: *Deleted

## 2016-08-02 NOTE — Telephone Encounter (Signed)
Pt called stating he need refills for his medication. Per pt, he is out of his Xanax. Informed pt per his chart, his medication was last printed on 07-07-16 with 2 refills. Per pt, he took the script to the pharmacy the very first time. Informed pt that he would need to call his pharmacy and spoke with someone to see if there's any Xanax on hold. Per pt, he do not have any refills because the bottle says no refills. Office called pt pharmacy and spoke with Bonita QuinLinda. Per Bonita QuinLinda, pt last pick up date was July 27 th and he is too early for pick up. Per Bonita QuinLinda, per their policy, they can only pick up 2 days early. Per Bonita QuinLinda, if the doctor office wants, they can approve an earlier fill date. Office came back on phone with pt. Informed pt with what Bonita QuinLinda stated and pt stated oh yeah he did pick up July 27 th. Per pt, but he only have 1 more tablet left. Asked pt how often is he taking his Xanax due to his directions for Xanax 1 mg is to take 1 tablets 4 times a day. Per pt, on a bad day he takes 5 tablets and on really bad days he takes 6 tablets. Per pt, when he last came to office for f/u, he informed Dr. Tenny Crawoss that he at times take 6 tablets when he's having a really bad day. Per pt, he guess he'll just have to wait until Aug 25 th to get his Xanax filled since his pharmacy can't fill medication until then.

## 2016-08-03 NOTE — Telephone Encounter (Signed)
Called pt number on file and it went to straight to voicemail and message stated mailbox is full. Unable to leave message or get a hold of pt. Will try calling pt at another time.

## 2016-08-03 NOTE — Telephone Encounter (Signed)
We cannot refill early. 

## 2016-08-17 ENCOUNTER — Other Ambulatory Visit (HOSPITAL_COMMUNITY): Payer: Self-pay | Admitting: Psychiatry

## 2016-08-30 ENCOUNTER — Encounter (HOSPITAL_COMMUNITY): Payer: Self-pay | Admitting: Psychiatry

## 2016-08-30 ENCOUNTER — Ambulatory Visit (INDEPENDENT_AMBULATORY_CARE_PROVIDER_SITE_OTHER): Payer: 59 | Admitting: Psychiatry

## 2016-08-30 VITALS — BP 112/72 | HR 72 | Ht 66.0 in | Wt 158.6 lb

## 2016-08-30 DIAGNOSIS — F411 Generalized anxiety disorder: Secondary | ICD-10-CM | POA: Diagnosis not present

## 2016-08-30 DIAGNOSIS — F322 Major depressive disorder, single episode, severe without psychotic features: Secondary | ICD-10-CM

## 2016-08-30 MED ORDER — FLUOXETINE HCL 20 MG PO CAPS: 20.0000 mg | ORAL_CAPSULE | Freq: Two times a day (BID) | ORAL | 2 refills | Status: DC

## 2016-08-30 MED ORDER — ALPRAZOLAM 1 MG PO TABS: 1.0000 mg | ORAL_TABLET | Freq: Four times a day (QID) | ORAL | 2 refills | Status: DC

## 2016-08-30 MED ORDER — TRAZODONE HCL 100 MG PO TABS: ORAL_TABLET | ORAL | 2 refills | Status: DC

## 2016-08-30 NOTE — Progress Notes (Signed)
Patient ID: Allen Conley. Allen Conley, male   DOB: 1971-07-26, 45 y.o.   MRN: [id number] Patient ID: Allen Conley. Allen Conley, male   DOB: 18-Dec-1970, 45 y.o.   MRN: [id number] Patient ID: Allen Conley. Allen Conley, male   DOB: 1971/03/07, 45 y.o.   MRN: [id number] Patient ID: Allen Conley. Allen Conley, male   DOB: [birth year], 45 y.o.   MRN: [id number] Patient ID: Allen Conley. Allen Conley, male   DOB: [birth year], 45 y.o.   MRN: [id number] Patient ID: Allen Conley. Allen Conley, male   DOB: 12-19-1970, 45 y.o.   MRN: [id number] Patient ID: Allen Conley. Allen Conley, male   DOB: 1971/04/13, 45 y.o.   MRN: [id number] Patient ID: Allen Conley. Allen Conley, male   DOB: Apr 15, 1971, 45 y.o.   MRN: [id number] Patient ID: Allen Conley. Allen Conley, male   DOB: Conley 08, 1972, 45 y.o.   MRN: [id number] Patient ID: Allen Conley. Allen Conley, male   DOB: Jun 03, 1971, 45 y.o.   MRN: [id number] Patient ID: Allen Conley. Allen Conley, male   DOB: [birth year], 45 y.o.   MRN: [id number]  Psychiatric Assessment Adult  [name]. Cail Date of Evaluation:  08/30/2016 Chief Complaint: "I'm not sleeping" History of Chief Complaint:   Chief Complaint  Patient presents with  . Anxiety  . Depression  . Follow-up    Depression         Past medical history includes anxiety.   Anxiety  Symptoms include nervous/anxious behavior.    Drug Problem    this patient is a 45 year old single white male lives alone in [place]. He works for [employer].  The patient was referred by his primary physician, Dr. Regino Schultze, for further assessment and treatment of depression and anxiety.  The patient states that his depression has been building over the last several years. As a baby his mother gave him up to his maternal grandmother and she raised him off his life. When he turned about 55 years old his grandmother's health declined. Between the ages of 29 and 47 he did nothing but go to work school and take care of his grandmother. He claims he "didn't have a life". His grandmother died when he was 32 and this was very difficult for him. He  moved in with his mother her husband and his brother but he can get along with them because little lifestyle there was noisy and chaotic.  About a year ago he moved out on his own. He was hoping things would improve but instead he's gotten increasingly depressed. He was dating a girl for a while and she moved away several months ago and he is been really on the bottom since then. He feels sad all the time, nothing makes him happy, his energy is low he's very anxious and has panic attacks and breakdowns he worries and ruminates about everything constantly He is sleeping and eating well. He denies overt suicidal ideation but feels like it wouldn't matter if his life ended. On the positive side he is very conscious about health and exercise, works out frequently and runs. He does not have any psychotic symptoms. He was drinking a lot until about 3 months ago but is cut it down considerably to 1 or 2 drinks every few weeks. He does not use drugs.  Dr. Diona Browner tried him on Zoloft, Paxil, then a combination of Paxil and Wellbutrin which caused severe tremors. More recently he's been on lithium but couldn't tolerate it. He was on clonazepam which did not help his anxiety and now he is on Ativan. He doesn't  seem to help but he waits until he is having a panic attack to take it  The patient returns after 4 weeks. 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 He is basically working one week on an 1 week off. On the weeks off he doesn't feel any better. He still can't sleep and often is up for 2 days in a row. He still obsessing about being alone and hating it. The Prozac has only been in his system for about 3 weeks and he is not sure it is helping. His anxiety is really bad. At times he uses more Xanax than prescribed and I told him not to do this as if he runs out early he will go through withdrawal. I cannot give him more than 4 mg a day. The patient denies suicidal ideation and thinks he'll feel  a lot better if he could get some sleep. I will prescribe trazodone for this but also suggested he get back into his exercise routine and he agrees  Review of Systems  Constitutional: Positive for activity change.  Eyes: Negative.   Respiratory: Negative.   Cardiovascular: Negative.   Gastrointestinal: Negative.   Endocrine: Negative.   Genitourinary: Negative.   Musculoskeletal: Negative.   Allergic/Immunologic: Negative.   Neurological: Negative.   Hematological: Negative.   Psychiatric/Behavioral: Positive for depression and dysphoric mood. The patient is nervous/anxious.    Physical Exam not done  Depressive Symptoms: depressed mood, anhedonia, feelings of worthlessness/guilt, difficulty concentrating, hopelessness, anxiety, panic attacks, loss of energy/fatigue,  (Hypo) Manic Symptoms:   Elevated Mood:  No Irritable Mood:  No Grandiosity:  No Distractibility:  No Labiality of Mood:  No Delusions:  No Hallucinations:  No Impulsivity:  No Sexually Inappropriate Behavior:  No Financial Extravagance:  No Flight of Ideas:  No  Anxiety Symptoms: Excessive Worry:  Yes Panic Symptoms:  Yes Agoraphobia:  No Obsessive Compulsive: No  Symptoms: None, Specific Phobias:  No Social Anxiety:  No  Psychotic Symptoms:  Hallucinations: No None Delusions:  No Paranoia:  No   Ideas of Reference:  No  PTSD Symptoms: Ever had a traumatic exposure:  No Had a traumatic exposure in the last month:  No Re-experiencing: No None Hypervigilance:  No Hyperarousal: No None Avoidance: No None  Traumatic Brain Injury: No   Past Psychiatric History: Diagnosis: Depression, anxiety   Hospitalizations: none  Outpatient Care: Only through primary care   Substance Abuse Care: none  Self-Mutilation: none  Suicidal Attempts: none  Violent Behaviors: none   Past Medical History:   Past Medical History:  Diagnosis Date  . Depression    zoloft no help   . GAD (generalized  anxiety disorder)   . Panic attacks   . Serotonin syndrome 09/17/2014   on paxil + wellbutrin   History of Loss of Consciousness:  No Seizure History:  No Cardiac History:  No Allergies:  No Known Allergies Current Medications:  Current Outpatient Prescriptions  Medication Sig Dispense Refill  . ALPRAZolam (XANAX) 1 MG tablet Take 1 tablet (1 mg total) by mouth 4 (four) times daily. 120 tablet 2  . busPIRone (BUSPAR) 30 MG tablet Take 1 tablet (30 mg total) by mouth 2 (two) times daily. 180 tablet 2  . FLUoxetine (PROZAC) 20 MG capsule Take 1 capsule (20 mg total) by mouth 2 (two) times daily. 60 capsule 2  . traZODone (DESYREL) 100 MG tablet Take one or two at bedtime 60 tablet 2   No  current facility-administered medications for this visit.     Previous Psychotropic Medications:  Medication Dose   Zoloft, Paxil, Wellbutrin, clonazepam                        Substance Abuse History in the last 12 months: Substance Age of 1st Use Last Use Amount Specific Type  Nicotine      Alcohol    drinks very occasionally    Cannabis      Opiates      Cocaine      Methamphetamines      LSD      Ecstasy      Benzodiazepines      Caffeine      Inhalants      Others:                          Medical Consequences of Substance Abuse: none  Legal Consequences of Substance Abuse: none  Family Consequences of Substance Abuse: none  Blackouts:  No DT's:  No Withdrawal Symptoms:  No None  Social History: Current Place of Residence: [place] [address] of Birth: [place] Family Members: Mother, stepfather, 2 brothers Marital Status:  Single Children:none Relationships: Currently not dating 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 Legal History: none Hobbies/Interests: Weightlifting and  running  Family History:   Family History  Problem Relation Age of Onset  . Cancer Maternal Grandmother   . Cancer Maternal Grandfather   . Depression Mother   . Drug abuse Brother     Mental Status Examination/Evaluation: Objective:  Appearance: Casual, Neat and Well Groomed   Patent attorney::  Fair  Speech:  Clear and Coherent  Volume:  Normal  Mood:  Anxious   Affect:Dysphoric   Thought Process:  Goal Directed  Orientation:  Full (Time, Place, and Person)  Thought Content:  Rumination  Suicidal Thoughts: None   Homicidal Thoughts:  No  Judgement:  Fair   Insight: Fair   Psychomotor Activity: Normal   Akathisia:  No  Handed:  Right  AIMS (if indicated):    Assets:  Communication Skills Desire for Improvement Physical Health Resilience Talents/Skills Vocational/Educational    Laboratory/X-Ray Psychological Evaluation(s)       Assessment:  Axis I: Major Depression, single episode  AXIS I Generalized Anxiety Disorder and Major Depression, single episode  AXIS II Deferred  AXIS III Past Medical History:  Diagnosis Date  . Depression    zoloft no help   . GAD (generalized anxiety disorder)   . Panic attacks   . Serotonin syndrome 09/17/2014   on paxil + wellbutrin     AXIS IV other psychosocial or environmental problems  AXIS V 41-50 serious symptoms   Treatment Plan/Recommendations:  Plan of Care: Medication management   Laboratory:    Psychotherapy: He is seeing Sudie Bailey here  Medications: He will Continue BuSpar for anxiety at 30 g twice a day  and Xanax  1 mg 4 times a day. Will Continue Prozac 40 mg daily for depression and add trazodone 100-200 mg at bedtime to help with sleep   Routine PRN Medications:  No  Consultations:   Safety Concerns:  He denies thoughts of harm to self or others   Other:  He will return in 4 weeks     Diannia Ruder, MD 9/18/20178:23  AM    Patient ID: Allen JuneAlex N. Ephriam KnucklesClifton, male   DOB: 08-Sep-1971, 45 y.o.   MRN:  [id number] Patient ID: Allen Junelex N. Ephriam KnucklesClifton, male   DOB: 08-Sep-1971, 45 y.o.   MRN: [id number]

## 2016-08-31 ENCOUNTER — Ambulatory Visit (HOSPITAL_COMMUNITY): Payer: Self-pay | Admitting: Psychiatry

## 2016-09-27 ENCOUNTER — Encounter (HOSPITAL_COMMUNITY): Payer: Self-pay

## 2016-09-27 ENCOUNTER — Ambulatory Visit (HOSPITAL_COMMUNITY): Payer: Self-pay | Admitting: Psychiatry

## 2016-10-04 ENCOUNTER — Telehealth (HOSPITAL_COMMUNITY): Payer: Self-pay | Admitting: *Deleted

## 2016-10-04 NOTE — Telephone Encounter (Signed)
VOICE MESSAGE FROM PATIENT WANTS TO SCHEDULE APPOINTMENT.   NEED TO KNOW WHAT TO DO SINCE SEVERAL NO SHOWS.

## 2016-10-04 NOTE — Telephone Encounter (Signed)
We can give him one more chance 

## 2016-10-04 NOTE — Telephone Encounter (Signed)
Called pt number on file and message came up stating he is sleeping and won't get up until 3pm. Staff left message and provided office number.

## 2016-10-04 NOTE — Telephone Encounter (Signed)
Spoke with pt and informed him with what provider stated and pt verbalized understanding. Appt was resch

## 2016-10-05 ENCOUNTER — Encounter (HOSPITAL_COMMUNITY): Payer: Self-pay | Admitting: Psychiatry

## 2016-10-05 ENCOUNTER — Ambulatory Visit (INDEPENDENT_AMBULATORY_CARE_PROVIDER_SITE_OTHER): Payer: 59 | Admitting: Psychiatry

## 2016-10-05 DIAGNOSIS — F411 Generalized anxiety disorder: Secondary | ICD-10-CM

## 2016-10-05 DIAGNOSIS — F322 Major depressive disorder, single episode, severe without psychotic features: Secondary | ICD-10-CM

## 2016-10-05 DIAGNOSIS — Z818 Family history of other mental and behavioral disorders: Secondary | ICD-10-CM

## 2016-10-05 DIAGNOSIS — Z813 Family history of other psychoactive substance abuse and dependence: Secondary | ICD-10-CM

## 2016-10-05 DIAGNOSIS — Z808 Family history of malignant neoplasm of other organs or systems: Secondary | ICD-10-CM

## 2016-10-05 MED ORDER — FLUOXETINE HCL 20 MG PO CAPS: 60.0000 mg | ORAL_CAPSULE | ORAL | 2 refills | Status: DC

## 2016-10-05 MED ORDER — TRAZODONE HCL 100 MG PO TABS: 100.0000 mg | ORAL_TABLET | Freq: Every day | ORAL | 2 refills | Status: DC

## 2016-10-05 MED ORDER — BUSPIRONE HCL 30 MG PO TABS: 30.0000 mg | ORAL_TABLET | Freq: Two times a day (BID) | ORAL | 2 refills | Status: DC

## 2016-10-05 NOTE — Progress Notes (Signed)
Patient ID: Allen Conley. Wessler, male   DOB: 1971/04/11, 45 y.o.   MRN: [id number] Patient ID: Kalik Hoare. Santilli, male   DOB: 10-Mar-1971, 45 y.o.   MRN: [id number] Patient ID: Camp Gopal. Harmes, male   DOB: 1971-10-06, 45 y.o.   MRN: [id number] Patient ID: Cleaven Demario. Arca, male   DOB: 1971/05/16, 45 y.o.   MRN: [id number] Patient ID: Sanav Remer. Gavia, male   DOB: 1971/08/27, 45 y.o.   MRN: [id number] Patient ID: Connor Meacham. Crossan, male   DOB: Nov 01, 1971, 45 y.o.   MRN: [id number] Patient ID: Karion Cudd. Bann, male   DOB: 06-17-1971, 45 y.o.   MRN: [id number] Patient ID: Eriverto Byrnes. Godsey, male   DOB: [birth year], 45 y.o.   MRN: [id number] Patient ID: Maurio Baize. Heffington, male   DOB: [birth year], 45 y.o.   MRN: [id number] Patient ID: Jabron Weese. Percival, male   DOB: 10/10/1971, 45 y.o.   MRN: [id number] Patient ID: Lindwood Mogel. Isabell, male   DOB: 06-12-1971, 45 y.o.   MRN: [id number]  Psychiatric Assessment Adult  Patient Identification:  Orvin Netter. Krawczyk Date of Evaluation:  10/05/2016 Chief Complaint: "I'm not sleeping" History of Chief Complaint:   Chief Complaint  Patient presents with  . Depression  . Anxiety  . Follow-up    Anxiety  Symptoms include nervous/anxious behavior.    Depression         Past medical history includes anxiety.   Drug Problem    this patient is a 45 year old single white male lives alone in [place]. He works for [employer].  The patient was referred by his primary physician, Dr. Regino Schultze, for further assessment and treatment of depression and anxiety.  The patient states that his depression has been building over the last several years. As a baby his mother gave him up to his maternal grandmother and she raised him off his life. When he turned about 89 years old his grandmother's health declined. Between the ages of 5 and 70 he did nothing but go to work school and take care of his grandmother. He claims he "didn't have a life". His grandmother died when he was 70 and this was very difficult for him. He  moved in with his mother her husband and his brother but he can get along with them because little lifestyle there was noisy and chaotic.  About a year ago he moved out on his own. He was hoping things would improve but instead he's gotten increasingly depressed. He was dating a girl for a while and she moved away several months ago and he is been really on the bottom since then. He feels sad all the time, nothing makes him happy, his energy is low he's very anxious and has panic attacks and breakdowns he worries and ruminates about everything constantly He is sleeping and eating well. He denies overt suicidal ideation but feels like it wouldn't matter if his life ended. On the positive side he is very conscious about health and exercise, works out frequently and runs. He does not have any psychotic symptoms. He was drinking a lot until about 3 months ago but is cut it down considerably to 1 or 2 drinks every few weeks. He does not use drugs.  Dr. Diona Browner tried him on Zoloft, Paxil, then a combination of Paxil and Wellbutrin which caused severe tremors. More recently he's been on lithium but couldn't tolerate it. He was on clonazepam which did not help his anxiety and now he is on Ativan. He doesn't  seem to help but he waits until he is having a panic attack to take it  The patient returns after 4 weeks. He seems to be doing somewhat better and is sleeping better with the trazodone. He still tends to worry and obsess about small things and wonders if we can go up on the Prozac. I suggested we go to 60 mg next. His mood seems to be a little bit better and he is trying to look for another job. He denies thoughts of self-harm or suicidal ideation. He does feel very tired all the time and I suggested he have a complete physical and perhaps have his testosterone checked. He's not working out as intensively as he used to and has lost about 15 pounds in a lot of muscle bulk  Review of Systems  Constitutional:  Positive for activity change.  Eyes: Negative.   Respiratory: Negative.   Cardiovascular: Negative.   Gastrointestinal: Negative.   Endocrine: Negative.   Genitourinary: Negative.   Musculoskeletal: Negative.   Allergic/Immunologic: Negative.   Neurological: Negative.   Hematological: Negative.   Psychiatric/Behavioral: Positive for depression and dysphoric mood. The patient is nervous/anxious.    Physical Exam not done  Depressive Symptoms: depressed mood, anhedonia, feelings of worthlessness/guilt, difficulty concentrating, hopelessness, anxiety, panic attacks, loss of energy/fatigue,  (Hypo) Manic Symptoms:   Elevated Mood:  No Irritable Mood:  No Grandiosity:  No Distractibility:  No Labiality of Mood:  No Delusions:  No Hallucinations:  No Impulsivity:  No Sexually Inappropriate Behavior:  No Financial Extravagance:  No Flight of Ideas:  No  Anxiety Symptoms: Excessive Worry:  Yes Panic Symptoms:  Yes Agoraphobia:  No Obsessive Compulsive: No  Symptoms: None, Specific Phobias:  No Social Anxiety:  No  Psychotic Symptoms:  Hallucinations: No None Delusions:  No Paranoia:  No   Ideas of Reference:  No  PTSD Symptoms: Ever had a traumatic exposure:  No Had a traumatic exposure in the last month:  No Re-experiencing: No None Hypervigilance:  No Hyperarousal: No None Avoidance: No None  Traumatic Brain Injury: No   Past Psychiatric History: Diagnosis: Depression, anxiety   Hospitalizations: none  Outpatient Care: Only through primary care   Substance Abuse Care: none  Self-Mutilation: none  Suicidal Attempts: none  Violent Behaviors: none   Past Medical History:   Past Medical History:  Diagnosis Date  . Depression    zoloft no help   . GAD (generalized anxiety disorder)   . Panic attacks   . Serotonin syndrome 09/17/2014   on paxil + wellbutrin   History of Loss of Consciousness:  No Seizure History:  No Cardiac History:   No Allergies:  No Known Allergies Current Medications:  Current Outpatient Prescriptions  Medication Sig Dispense Refill  . ALPRAZolam (XANAX) 1 MG tablet Take 1 tablet (1 mg total) by mouth 4 (four) times daily. 120 tablet 2  . busPIRone (BUSPAR) 30 MG tablet Take 1 tablet (30 mg total) by mouth 2 (two) times daily. 180 tablet 2  . FLUoxetine (PROZAC) 20 MG capsule Take 3 capsules (60 mg total) by mouth every morning. 270 capsule 2  . traZODone (DESYREL) 100 MG tablet Take 1 tablet (100 mg total) by mouth at bedtime. Take one or two at bedtime 90 tablet 2   No current facility-administered medications for this visit.     Previous Psychotropic Medications:  Medication Dose   Zoloft, Paxil, Wellbutrin, clonazepam  Substance Abuse History in the last 12 months: Substance Age of 1st Use Last Use Amount Specific Type  Nicotine      Alcohol    drinks very occasionally    Cannabis      Opiates      Cocaine      Methamphetamines      LSD      Ecstasy      Benzodiazepines      Caffeine      Inhalants      Others:                          Medical Consequences of Substance Abuse: none  Legal Consequences of Substance Abuse: none  Family Consequences of Substance Abuse: none  Blackouts:  No DT's:  No Withdrawal Symptoms:  No None  Social History: Current Place of Residence: [address] of Birth: [place] Family Members: Mother, stepfather, 2 brothers Marital Status:  Single Children:none Relationships: Currently not dating 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 Legal History: none Hobbies/Interests: Weightlifting and running  Family History:   Family History  Problem Relation Age of Onset  . Cancer Maternal Grandmother   . Cancer Maternal  Grandfather   . Depression Mother   . Drug abuse Brother     Mental Status Examination/Evaluation: Objective:  Appearance: Casual, Neat and Well Groomed   Patent attorney::  Fair  Speech:  Clear and Coherent  Volume:  Normal  Mood:  AnxiousBut less depressed   Affect:Brighter   Thought Process:  Goal Directed  Orientation:  Full (Time, Place, and Person)  Thought Content:  Rumination  Suicidal Thoughts: None   Homicidal Thoughts:  No  Judgement:  Fair   Insight: Fair   Psychomotor Activity: Normal   Akathisia:  No  Handed:  Right  AIMS (if indicated):    Assets:  Communication Skills Desire for Improvement Physical Health Resilience Talents/Skills Vocational/Educational    Laboratory/X-Ray Psychological Evaluation(s)       Assessment:  Axis I: Major Depression, single episode  AXIS I Generalized Anxiety Disorder and Major Depression, single episode  AXIS II Deferred  AXIS III Past Medical History:  Diagnosis Date  . Depression    zoloft no help   . GAD (generalized anxiety disorder)   . Panic attacks   . Serotonin syndrome 09/17/2014   on paxil + wellbutrin     AXIS IV other psychosocial or environmental problems  AXIS V 41-50 serious symptoms   Treatment Plan/Recommendations:  Plan of Care: Medication management   Laboratory:    Psychotherapy: He is seeing Sudie Bailey here  Medications: He will Continue BuSpar for anxiety at 30 g twice a day  and Xanax  1 mg 4 times a day. Will Continue Prozac But increase the dose to 60 mg daily for depression and add trazodone 100-200 mg at bedtime to help with sleep   Routine PRN Medications:  No  Consultations:   Safety Concerns:  He denies thoughts of harm to self or others   Other:  He will return in 6 weeks     Diannia Ruder, MD 10/24/201710:29 AM    Patient ID: Allen Conley. Kronk, male   DOB: 08-06-1971, 45 y.o.   MRN: [id number] Patient ID: Yadiel Aubry. Allbritton, male   DOB: [birth year], 45 y.o.   MRN: [id number]

## 2016-11-16 ENCOUNTER — Ambulatory Visit (INDEPENDENT_AMBULATORY_CARE_PROVIDER_SITE_OTHER): Payer: 59 | Admitting: Psychiatry

## 2016-11-16 ENCOUNTER — Encounter (HOSPITAL_COMMUNITY): Payer: Self-pay | Admitting: Psychiatry

## 2016-11-16 VITALS — BP 107/60 | HR 82 | Ht 66.0 in | Wt 162.4 lb

## 2016-11-16 DIAGNOSIS — Z808 Family history of malignant neoplasm of other organs or systems: Secondary | ICD-10-CM | POA: Diagnosis not present

## 2016-11-16 DIAGNOSIS — Z79899 Other long term (current) drug therapy: Secondary | ICD-10-CM | POA: Diagnosis not present

## 2016-11-16 DIAGNOSIS — Z813 Family history of other psychoactive substance abuse and dependence: Secondary | ICD-10-CM

## 2016-11-16 DIAGNOSIS — F322 Major depressive disorder, single episode, severe without psychotic features: Secondary | ICD-10-CM

## 2016-11-16 DIAGNOSIS — Z818 Family history of other mental and behavioral disorders: Secondary | ICD-10-CM

## 2016-11-16 DIAGNOSIS — F411 Generalized anxiety disorder: Secondary | ICD-10-CM | POA: Diagnosis not present

## 2016-11-16 MED ORDER — BUSPIRONE HCL 30 MG PO TABS: 30.0000 mg | ORAL_TABLET | Freq: Two times a day (BID) | ORAL | 2 refills | Status: DC

## 2016-11-16 MED ORDER — FLUOXETINE HCL 20 MG PO CAPS: 60.0000 mg | ORAL_CAPSULE | ORAL | 2 refills | Status: DC

## 2016-11-16 MED ORDER — TRAZODONE HCL 100 MG PO TABS: 100.0000 mg | ORAL_TABLET | Freq: Every day | ORAL | 2 refills | Status: DC

## 2016-11-16 MED ORDER — ALPRAZOLAM 1 MG PO TABS: 1.0000 mg | ORAL_TABLET | Freq: Four times a day (QID) | ORAL | 2 refills | Status: DC

## 2016-11-16 NOTE — Progress Notes (Signed)
Patient ID: Allen Conley, male   DOB: [birth year], 45 y.o.   MRN: [id number] Patient ID: Allen Conley, male   DOB: [birth year], 45 y.o.   MRN: [id number] Patient ID: Allen Conley, male   DOB: [birth year], 45 y.o.   MRN: [id number] Patient ID: Allen Conley, male   DOB: [birth year], 45 y.o.   MRN: [id number] Patient ID: Allen Conley, male   DOB: [birth year], 45 y.o.   MRN: [id number] Patient ID: Allen Conley, male   DOB: [birth year], 45 y.o.   MRN: [id number] Patient ID: Allen Conley, male   DOB: [birth year], 45 y.o.   MRN: [id number] Patient ID: Allen Conley, male   DOB: [birth year], 45 y.o.   MRN: [id number] Patient ID: Allen Conley, male   DOB: [birth year], 45 y.o.   MRN: [id number] Patient ID: Allen Conley, male   DOB: [birth year], 45 y.o.   MRN: [id number] Patient ID: Allen Conley, male   DOB: [birth year], 45 y.o.   MRN: [id number]  Psychiatric Assessment Adult  [name] Date of Evaluation:  11/16/2016 Chief Complaint: "I feel blank History of Chief Complaint:   Chief Complaint  Patient presents with  . Depression  . Anxiety  . Follow-up    Depression         Past medical history includes anxiety.   Anxiety  Symptoms include nervous/anxious behavior.    Drug Problem    this patient is a 45 year old single white male lives [place] in [place]. He works for [employer].  The patient was referred by his primary physician, Dr. Regino SchultzeMcGough, for further assessment and treatment of depression and anxiety.  The patient states that his depression has been building over the last several years. As a baby his mother gave him up to his maternal grandmother and she raised him off his life. When he turned about 45 years old his grandmother's health declined. Between the ages of [age] and [age] he did nothing but go to work school and take care of his grandmother. He claims he "didn't have a life". His grandmother died when he was [age] and this was very difficult for him. He moved in  with his mother her husband and his brother but he can get along with them because little lifestyle there was noisy and chaotic.  About a year ago he moved out on his own. He was hoping things would improve but instead he's gotten increasingly depressed. He was dating a girl for a while and she moved away several months ago and he is been really on the bottom since then. He feels sad all the time, nothing makes him happy, his energy is low he's very anxious and has panic attacks and breakdowns he worries and ruminates about everything constantly He is sleeping and eating well. He denies overt suicidal ideation but feels like it wouldn't matter if his life ended. On the positive side he is very conscious about health and exercise, works out frequently and runs. He does not have any psychotic symptoms. He was drinking a lot until about 3 months ago but is cut it down considerably to 1 or 2 drinks every few weeks. He does not use drugs.  Dr. Diona BrownerMcDowell tried him on Zoloft, Paxil, then a combination of Paxil and Wellbutrin which caused severe tremors. More recently he's been on lithium but couldn't tolerate it. He was on clonazepam which did not help his anxiety and now he is on Ativan. He doesn't  seem to help but he waits until he is having a panic attack to take it  The patient returns after 6 weeks. He denies being depressed anymore but now he feels blank and uninterested in everything. He's not going to the gym and is never any interest in doing much of anything. He still going to work and home and that's about it. I suggested maybe we cut the Prozac down a little bit but he doesn't want to at this point or change any of his other medications. He denies any thoughts of suicide or self-harm. I told him at 60 mg Prozac may be making him feel blunted but he would like to continue until next visit so we'll give this another 4 weeks  Review of Systems  Constitutional: Positive for activity change.  Eyes: Negative.    Respiratory: Negative.   Cardiovascular: Negative.   Gastrointestinal: Negative.   Endocrine: Negative.   Genitourinary: Negative.   Musculoskeletal: Negative.   Allergic/Immunologic: Negative.   Neurological: Negative.   Hematological: Negative.   Psychiatric/Behavioral: Positive for depression and dysphoric mood. The patient is nervous/anxious.    Physical Exam not done  Depressive Symptoms: depressed mood, anhedonia, feelings of worthlessness/guilt, difficulty concentrating, hopelessness, anxiety, panic attacks, loss of energy/fatigue,  (Hypo) Manic Symptoms:   Elevated Mood:  No Irritable Mood:  No Grandiosity:  No Distractibility:  No Labiality of Mood:  No Delusions:  No Hallucinations:  No Impulsivity:  No Sexually Inappropriate Behavior:  No Financial Extravagance:  No Flight of Ideas:  No  Anxiety Symptoms: Excessive Worry:  Yes Panic Symptoms:  Yes Agoraphobia:  No Obsessive Compulsive: No  Symptoms: None, Specific Phobias:  No Social Anxiety:  No  Psychotic Symptoms:  Hallucinations: No None Delusions:  No Paranoia:  No   Ideas of Reference:  No  PTSD Symptoms: Ever had a traumatic exposure:  No Had a traumatic exposure in the last month:  No Re-experiencing: No None Hypervigilance:  No Hyperarousal: No None Avoidance: No None  Traumatic Brain Injury: No   Past Psychiatric History: Diagnosis: Depression, anxiety   Hospitalizations: none  Outpatient Care: Only through primary care   Substance Abuse Care: none  Self-Mutilation: none  Suicidal Attempts: none  Violent Behaviors: none   Past Medical History:   Past Medical History:  Diagnosis Date  . Depression    zoloft no help   . GAD (generalized anxiety disorder)   . Panic attacks   . Serotonin syndrome 09/17/2014   on paxil + wellbutrin   History of Loss of Consciousness:  No Seizure History:  No Cardiac History:  No Allergies:  No Known Allergies Current Medications:   Current Outpatient Prescriptions  Medication Sig Dispense Refill  . ALPRAZolam (XANAX) 1 MG tablet Take 1 tablet (1 mg total) by mouth 4 (four) times daily. 120 tablet 2  . busPIRone (BUSPAR) 30 MG tablet Take 1 tablet (30 mg total) by mouth 2 (two) times daily. 180 tablet 2  . FLUoxetine (PROZAC) 20 MG capsule Take 3 capsules (60 mg total) by mouth every morning. 270 capsule 2  . traZODone (DESYREL) 100 MG tablet Take 1 tablet (100 mg total) by mouth at bedtime. Take one or two at bedtime 90 tablet 2   No current facility-administered medications for this visit.     Previous Psychotropic Medications:  Medication Dose   Zoloft, Paxil, Wellbutrin, clonazepam  Substance Abuse History in the last 12 months: Substance Age of 1st Use Last Use Amount Specific Type  Nicotine      Alcohol    drinks very occasionally    Cannabis      Opiates      Cocaine      Methamphetamines      LSD      Ecstasy      Benzodiazepines      Caffeine      Inhalants      Others:                          Medical Consequences of Substance Abuse: none  Legal Consequences of Substance Abuse: none  Family Consequences of Substance Abuse: none  Blackouts:  No DT's:  No Withdrawal Symptoms:  No None  Social History: Current Place of Residence: [address] of Birth: [place] Family Members: Mother, stepfather, 2 brothers Marital Status:  Single Children:none Relationships: Currently not dating 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 Legal History: none Hobbies/Interests: Weightlifting and running  Family History:   Family History  Problem Relation Age of Onset  . Cancer Maternal Grandmother   . Cancer Maternal Grandfather   . Depression Mother   . Drug abuse Brother      Mental Status Examination/Evaluation: Objective:  Appearance: Casual, Neat and Well Groomed   Patent attorney::  Fair  Speech:  Clear and Coherent  Volume:  Normal  Mood:Blunted   Affect Somewhat constricted   Thought Process:  Goal Directed  Orientation:  Full (Time, Place, and Person)  Thought Content:  Rumination  Suicidal Thoughts: None   Homicidal Thoughts:  No  Judgement:  Fair   Insight: Fair   Psychomotor Activity: Normal   Akathisia:  No  Handed:  Right  AIMS (if indicated):    Assets:  Communication Skills Desire for Improvement Physical Health Resilience Talents/Skills Vocational/Educational    Laboratory/X-Ray Psychological Evaluation(s)       Assessment:  Axis I: Major Depression, single episode  AXIS I Generalized Anxiety Disorder and Major Depression, single episode  AXIS II Deferred  AXIS III Past Medical History:  Diagnosis Date  . Depression    zoloft no help   . GAD (generalized anxiety disorder)   . Panic attacks   . Serotonin syndrome 09/17/2014   on paxil + wellbutrin     AXIS IV other psychosocial or environmental problems  AXIS V 41-50 serious symptoms   Treatment Plan/Recommendations:  Plan of Care: Medication management   Laboratory:    Psychotherapy: He is seeing Sudie Bailey here  Medications: He will Continue BuSpar for anxiety at 30 g twice a day  and Xanax  1 mg 4 times a day. Will Continue Prozac o 60 mg daily for depression and add trazodone 100-200 mg at bedtime to help with sleep   Routine PRN Medications:  No  Consultations:   Safety Concerns:  He denies thoughts of harm to self or others   Other:  He will return in 4 weeks     Allen Ruder, Allen Conley 12/5/20174:06 PM    Patient ID: Allen Conley, male   DOB: [birth year], 45 y.o.   MRN: [id number] Patient ID: Allen Conley, male   DOB: [birth year], 45 y.o.   MRN: [id number]

## 2016-11-24 ENCOUNTER — Telehealth (HOSPITAL_COMMUNITY): Payer: Self-pay | Admitting: *Deleted

## 2016-11-24 NOTE — Telephone Encounter (Signed)
ok 

## 2016-11-24 NOTE — Telephone Encounter (Signed)
Pt Allen Conley from pt pharmacy called to verify pt Trazodone direction. Informed [name] that per chart, pt is instructed to take 100-200 mg QHS for sleep. Per [name], the quantity that came to them does not match the SIG. Would office like to change it to 180 tablets (because they are a mail order) instead of 90 tabs.

## 2016-12-13 DIAGNOSIS — F1110 Opioid abuse, uncomplicated: Secondary | ICD-10-CM

## 2016-12-13 HISTORY — DX: Opioid abuse, uncomplicated: F11.10

## 2016-12-20 ENCOUNTER — Encounter (HOSPITAL_COMMUNITY): Payer: Self-pay

## 2016-12-20 ENCOUNTER — Ambulatory Visit (HOSPITAL_COMMUNITY): Payer: Self-pay | Admitting: Psychiatry

## 2017-03-02 ENCOUNTER — Ambulatory Visit: Payer: Self-pay | Admitting: Family Medicine

## 2017-03-02 NOTE — Progress Notes (Deleted)
OFFICE VISIT  03/02/2017   CC: No chief complaint on file.    HPI:    Patient is a 46 y.o. Caucasian male who presents for ***  Past Medical History:  Diagnosis Date  . Depression    zoloft no help   . GAD (generalized anxiety disorder)   . Panic attacks   . Serotonin syndrome 09/17/2014   on paxil + wellbutrin    No past surgical history on file.  Outpatient Medications Prior to Visit  Medication Sig Dispense Refill  . ALPRAZolam (XANAX) 1 MG tablet Take 1 tablet (1 mg total) by mouth 4 (four) times daily. 120 tablet 2  . busPIRone (BUSPAR) 30 MG tablet Take 1 tablet (30 mg total) by mouth 2 (two) times daily. 180 tablet 2  . FLUoxetine (PROZAC) 20 MG capsule Take 3 capsules (60 mg total) by mouth every morning. 270 capsule 2  . traZODone (DESYREL) 100 MG tablet Take 1 tablet (100 mg total) by mouth at bedtime. Take one or two at bedtime 90 tablet 2   No facility-administered medications prior to visit.     No Known Allergies  ROS As per HPI  PE: There were no vitals taken for this visit. ***  LABS:  Lab Results  Component Value Date   TSH 1.354 10/09/2014   Lab Results  Component Value Date   WBC 4.8 12/23/2015   HGB 15.2 12/23/2015   HCT 46.7 12/23/2015   MCV 95.7 12/23/2015   PLT 261 12/23/2015   Lab Results  Component Value Date   CREATININE 1.05 12/23/2015   BUN 22 (H) 12/23/2015   NA 141 12/23/2015   K 4.9 12/23/2015   CL 105 12/23/2015   CO2 31 12/23/2015   Lab Results  Component Value Date   ALT 18 10/09/2014   AST 30 10/09/2014   ALKPHOS 72 10/09/2014   BILITOT 0.4 10/09/2014    IMPRESSION AND PLAN:  No problem-specific Assessment & Plan notes found for this encounter.   FOLLOW UP: No Follow-up on file.

## 2017-03-04 ENCOUNTER — Ambulatory Visit: Payer: Self-pay | Admitting: Family Medicine

## 2017-03-04 NOTE — Progress Notes (Deleted)
OFFICE VISIT  03/04/2017   CC: No chief complaint on file.    HPI:    Patient is a 46 y.o. Caucasian male who presents for ***.   I have not seen him since 10/18/2014. He has been seeing [facility] in [place].  I reviewed her most recent note from 11/2016. At that time he felt depressed and emotionally blunted and had no motivation.  However, he felt like this was an improvement over how he felt before, so he did not want to change his med regimen any. He was continued on buspar, prozac, xanax, and trazodone (for sleep). He was instructed to f/u with Dr. Tenny Crawoss in 1 mo but it does not appear that he did this.    Past Medical History:  Diagnosis Date  . Depression    zoloft no help   . GAD (generalized anxiety disorder)   . Panic attacks   . Serotonin syndrome 09/17/2014   on paxil + wellbutrin    No past surgical history on file.  Outpatient Medications Prior to Visit  Medication Sig Dispense Refill  . ALPRAZolam (XANAX) 1 MG tablet Take 1 tablet (1 mg total) by mouth 4 (four) times daily. 120 tablet 2  . busPIRone (BUSPAR) 30 MG tablet Take 1 tablet (30 mg total) by mouth 2 (two) times daily. 180 tablet 2  . FLUoxetine (PROZAC) 20 MG capsule Take 3 capsules (60 mg total) by mouth every morning. 270 capsule 2  . traZODone (DESYREL) 100 MG tablet Take 1 tablet (100 mg total) by mouth at bedtime. Take one or two at bedtime 90 tablet 2   No facility-administered medications prior to visit.     No Known Allergies  ROS As per HPI  PE: There were no vitals taken for this visit. ***  LABS:  ***  IMPRESSION AND PLAN:  No problem-specific Assessment & Plan notes found for this encounter.   FOLLOW UP: No Follow-up on file.

## 2017-03-07 ENCOUNTER — Encounter: Payer: Self-pay | Admitting: Family Medicine

## 2017-03-08 ENCOUNTER — Encounter: Payer: Self-pay | Admitting: Family Medicine

## 2017-03-08 ENCOUNTER — Ambulatory Visit (INDEPENDENT_AMBULATORY_CARE_PROVIDER_SITE_OTHER): Payer: Managed Care, Other (non HMO) | Admitting: Family Medicine

## 2017-03-08 VITALS — BP 121/75 | HR 63 | Temp 98.3°F | Resp 16 | Ht 67.0 in | Wt 167.8 lb

## 2017-03-08 DIAGNOSIS — F1120 Opioid dependence, uncomplicated: Secondary | ICD-10-CM | POA: Diagnosis not present

## 2017-03-08 DIAGNOSIS — F32A Depression, unspecified: Secondary | ICD-10-CM

## 2017-03-08 DIAGNOSIS — F418 Other specified anxiety disorders: Secondary | ICD-10-CM

## 2017-03-08 DIAGNOSIS — F329 Major depressive disorder, single episode, unspecified: Secondary | ICD-10-CM

## 2017-03-08 DIAGNOSIS — F419 Anxiety disorder, unspecified: Secondary | ICD-10-CM

## 2017-03-08 MED ORDER — BUSPIRONE HCL 30 MG PO TABS: 30.0000 mg | ORAL_TABLET | Freq: Two times a day (BID) | ORAL | 2 refills | Status: DC

## 2017-03-08 NOTE — Progress Notes (Signed)
OFFICE VISIT  03/08/2017   CC:  Chief Complaint  Patient presents with  . Follow-up    needs refills on alprazolam, buspar and fluoxetine   HPI:    Patient is a 46 y.o. Caucasian male who presents for f/u anxiety and depression. He has been followed by [facility] in [place] for depression and anxiety. As of most recent visit there in December 11/2016 on buspar, xanax, prozac, and trazodone (sleep). He has been dismissed from that practice due to too many missed appointments.  He asks if he can resume f/u and med refills with me.  Says current regimen working well, just some blunting of emotions/affect that he says is acceptable b/c of how good the meds are helping.   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  Denies any alcohol intake.    Says he started self medicating with pain pills--percocet 10/325--about 1 yr.  Anywhere from 2-6 per day. Has been getting these "on the street", through friends.  Says he feels like he doesn't want inpatient detox.      Past Medical History:  Diagnosis Date  . Depression    zoloft no help   . GAD (generalized anxiety disorder)   . Panic attacks   . Serotonin syndrome 09/17/2014   on paxil + wellbutrin    History reviewed. No pertinent surgical history.  Outpatient Medications Prior to Visit  Medication Sig Dispense Refill  . ALPRAZolam (XANAX) 1 MG tablet Take 1 tablet (1 mg total) by mouth 4 (four) times daily. 120 tablet 2  . FLUoxetine (PROZAC) 20 MG capsule Take 3 capsules (60 mg total) by mouth every morning. 270 capsule 2  . busPIRone (BUSPAR) 30 MG tablet Take 1 tablet (30 mg total) by mouth 2 (two) times daily. 180 tablet 2  . traZODone (DESYREL) 100 MG tablet Take 1 tablet (100 mg total) by mouth at bedtime. Take one or two at bedtime (Patient not taking: Reported on 03/08/2017) 90 tablet 2   No facility-administered  medications prior to visit.     No Known Allergies  ROS As per HPI  PE: Blood pressure 121/75, pulse 63, temperature 98.3 F (36.8 C), temperature source Oral, resp. rate 16, height 5\' 7"  (1.702 m), weight 167 lb 12 oz (76.1 kg), SpO2 98 %. Wt Readings from Last 2 Encounters:  03/08/17 167 lb 12 oz (76.1 kg)  11/16/16 162 lb 6.4 oz (73.7 kg)    Gen: alert, oriented x 4, affect pleasant.  Lucid thinking and conversation noted. HEENT: PERRLA, EOMI.   Neck: no LAD, mass, or thyromegaly. CV: RRR, no m/r/g LUNGS: CTA bilat, nonlabored. NEURO: no tremor or tics noted on observation.  Coordination intact. CN 2-12 grossly intact bilaterally, strength 5/5 in all extremeties.  No ataxia.   LABS:  None today.  IMPRESSION AND PLAN:  1) Anxiety and depression: stable. Refilled buspar today. Not in need of prozac RF yet. Will RF xanax in the future (he runs out next week) if UDS shows appropriate results today--UDS sample sent to lab. Reviewed online controlled substances database today and see no suspicous activity: xanax last RF'd 02/13/17, #120 tabs, by Dr. Tenny Crawoss.  2) Narcotic addiction: ordered [facility] in [place] for psychiatrist to consider detox/ween and ongoing treatment for opioid addiction (?suboxone?).  An After Visit Summary was printed and given to the patient.  Spent 25 min with pt today, with >  50% of this time spent in counseling and care coordination regarding the above problems.  FOLLOW UP: Return in about 2 months (around 05/08/2017) for f/u psych.  Signed:  Santiago Bumpers, MD           03/08/2017

## 2017-03-08 NOTE — Progress Notes (Signed)
Pre visit review using our clinic review tool, if applicable. No additional management support is needed unless otherwise documented below in the visit note. 

## 2017-03-09 ENCOUNTER — Telehealth: Payer: Self-pay | Admitting: Family Medicine

## 2017-03-09 NOTE — Telephone Encounter (Signed)
Unable to leave message on patient's cell.   Need to let patient know that his referral has been sent to [facility] in [place] as that is closer to his home however [facility] has a [place] location as well. He can go to either. I checked with his insurance & his preferred provider is  in [place] ([facility]). If he would prefer to go there their number is [phone]. He can actually schedule his own appointment but I'll be happy to call them if he needs me to.

## 2017-03-10 ENCOUNTER — Other Ambulatory Visit: Payer: Self-pay | Admitting: Family Medicine

## 2017-03-14 NOTE — Telephone Encounter (Signed)
Rx faxed

## 2017-03-24 ENCOUNTER — Telehealth: Payer: Self-pay | Admitting: Family Medicine

## 2017-03-24 MED ORDER — ALPRAZOLAM 1 MG PO TABS: ORAL_TABLET | ORAL | 0 refills | Status: DC

## 2017-03-24 NOTE — Telephone Encounter (Signed)
Discussed UDS results from 03/08/17 specimen. It was + for expected drugs (oxycodone, alprazolam, and fluoxetine) but was unexpectedly + for cocaine. I explained to pt that I would have to ween him off alprazolam (he said most recent dose was yesterday), and I would not prescribe any controlled substances at all in the future.  He expressed understanding. We checked with his pharmacy, [facility], and they said he has no alprazolam rx's on file and no RFs with their pharmacy. Alprazolam ween rx sent to [facility] today.

## 2017-09-05 ENCOUNTER — Telehealth (HOSPITAL_COMMUNITY): Payer: Self-pay | Admitting: *Deleted

## 2017-09-05 NOTE — Telephone Encounter (Signed)
Pt called stating he taper himself off his Xanax 1 month ago. Per pt he did this because he did not want to get addicted to the Xanax. Per pt, his antidepressants is also not working. Asked pt if he is suicidal to himself or anyone else around him and pt verbalized he is not. Per pt also been really worry more lately. Per pt recently his symptoms have increased to the point he's been voluntarily taking himself out of work. Per pt his is happening more often now. Per pt chart, pt last appt with provider was 11-16-16. Pt f/u appt is next Wednesday 09-14-13. Per pt, he would like to know if Dr. Tenny Craw could prescribe another medication for him to help him out although he have not been seen since 11-16-2016. Per pt he is aware it's been awhile since he seen provider. Informed pt message will be sent to provider and pt verbalized understanding. Pt verified his number [phone].

## 2017-09-06 NOTE — Telephone Encounter (Signed)
Can't prescribe anything until he is seen. Maybe we can get him in sooner if someone cancels

## 2017-09-06 NOTE — Telephone Encounter (Signed)
Pt called office back and appt was made and pt is aware of sooner appt and verbalized understanding.

## 2017-09-06 NOTE — Telephone Encounter (Signed)
Called pt due to previous phone call. Staff unable to reach pt. Staff is attempting to sch pt for sooner appt for this Thursday 09-08-2017 but was unable to reach pt to verify if he could come to appt. Staff lmtcb and office number was provided on pt voicemail box.

## 2017-09-08 ENCOUNTER — Ambulatory Visit (INDEPENDENT_AMBULATORY_CARE_PROVIDER_SITE_OTHER): Payer: 59 | Admitting: Psychiatry

## 2017-09-08 ENCOUNTER — Encounter (HOSPITAL_COMMUNITY): Payer: Self-pay | Admitting: Psychiatry

## 2017-09-08 VITALS — BP 107/72 | HR 86 | Ht 67.0 in | Wt 165.4 lb

## 2017-09-08 DIAGNOSIS — Z818 Family history of other mental and behavioral disorders: Secondary | ICD-10-CM | POA: Diagnosis not present

## 2017-09-08 DIAGNOSIS — F329 Major depressive disorder, single episode, unspecified: Secondary | ICD-10-CM

## 2017-09-08 DIAGNOSIS — F322 Major depressive disorder, single episode, severe without psychotic features: Secondary | ICD-10-CM

## 2017-09-08 DIAGNOSIS — Z79899 Other long term (current) drug therapy: Secondary | ICD-10-CM | POA: Diagnosis not present

## 2017-09-08 DIAGNOSIS — F1110 Opioid abuse, uncomplicated: Secondary | ICD-10-CM

## 2017-09-08 DIAGNOSIS — F411 Generalized anxiety disorder: Secondary | ICD-10-CM | POA: Diagnosis not present

## 2017-09-08 MED ORDER — CLONAZEPAM 1 MG PO TABS: 1.0000 mg | ORAL_TABLET | Freq: Every day | ORAL | 0 refills | Status: DC | PRN

## 2017-09-08 MED ORDER — FLUOXETINE HCL 20 MG PO CAPS: 60.0000 mg | ORAL_CAPSULE | ORAL | 2 refills | Status: DC

## 2017-09-08 NOTE — Progress Notes (Signed)
Patient ID: Allen Conley, male   DOB: October 30, 1971, 46 y.o.   MRN: [id number] Patient ID: Allen Conley, male   DOB: [birth year], 46 y.o.   MRN: [id number] Patient ID: Allen Conley, male   DOB: 1971/11/02, 46 y.o.   MRN: [id number] Patient ID: Allen Conley, male   DOB: 07/02/1971, 46 y.o.   MRN: [id number] Patient ID: Allen Conley, male   DOB: 02/27/1971, 46 y.o.   MRN: [id number] Patient ID: Allen Conley, male   DOB: 09-Jun-1971, 45 y.o.   MRN: [id number] Patient ID: Allen Conley, male   DOB: [birth year], 46 y.o.   MRN: [id number] Patient ID: Allen Conley, male   DOB: September 18, 1971, 46 y.o.   MRN: [id number] Patient ID: Allen Conley, male   DOB: Jul 01, 1971, 46 y.o.   MRN: [id number] Patient ID: Allen Conley, male   DOB: Apr 02, 1971, 46 y.o.   MRN: [id number] Patient ID: Allen Conley, male   DOB: [birth year], 46 y.o.   MRN: [id number]  Psychiatric Assessment Adult  [name] Date of Evaluation:  09/08/2017 Chief Complaint: "I feel blank History of Chief Complaint:   Chief Complaint  Patient presents with  . Depression  . Anxiety  . Follow-up    Depression         Past medical history includes anxiety.   Anxiety  Symptoms include nervous/anxious behavior.    Drug Problem    this patient is a 46 year old single white male lives alone in [place]. He works for [employer].  The patient was referred by his primary physician, Dr. Regino Schultze, for further assessment and treatment of depression and anxiety.  The patient states that his depression has been building over the last several years. As a baby his mother gave him up to his maternal grandmother and she raised him off his life. When he turned about 43 years old his grandmother's health declined. Between the ages of 30 and 89 he did nothing but go to work school and take care of his grandmother. He claims he "didn't have a life". His grandmother died when he was 63 and this was very difficult for him. He moved in  with his mother her husband and his brother but he can get along with them because little lifestyle there was noisy and chaotic.  About a year ago he moved out on his own. He was hoping things would improve but instead he's gotten increasingly depressed. He was dating a girl for a while and she moved away several months ago and he is been really on the bottom since then. He feels sad all the time, nothing makes him happy, his energy is low he's very anxious and has panic attacks and breakdowns he worries and ruminates about everything constantly He is sleeping and eating well. He denies overt suicidal ideation but feels like it wouldn't matter if his life ended. On the positive side he is very conscious about health and exercise, works out frequently and runs. He does not have any psychotic symptoms. He was drinking a lot until about 3 months ago but is cut it down considerably to 1 or 2 drinks every few weeks. He does not use drugs.  Dr. Diona Browner tried him on Zoloft, Paxil, then a combination of Paxil and Wellbutrin which caused severe tremors. More recently he's been on lithium but couldn't tolerate it. He was on clonazepam which did not help his anxiety and now he is on Ativan. He doesn't  seem to help but he waits until he is having a panic attack to take it  The patient returns after 10 months. He is actually dismissed from our practice for missing way too many appointments. Somehow he got back in. His family doctor saw him back in April and he admitted he had been using narcotics. His urine drug screen was positive for narcotics and cocaine along with benzodiazepines. The family doctor gave him a prescription to wean off Xanax and suggested substance abuse treatment. He was going to a Suboxone clinic for a while in [place] and seeing a therapist there but has stopped going to this as well. He states for the last 2 months he's been excessively anxious shaky can't function can't think straight etc. He  claims that the doctor giving him Suboxone also gave him up small amount of Xanax which doesn't make sense. I explained to him that since he was already dismissed we can take him back on a strict trial basis if he misses even one appointment he will be dismissed again and he agrees. I told him for now he needs to continue with Prozac and we can try a small dose of clonazepam but he will have to see our therapist here. As usual he's had a problem with the relationship and he usually gets extremely anxious and falls apart when relationships don't work out  Review of Systems  Constitutional: Positive for activity change.  Eyes: Negative.   Respiratory: Negative.   Cardiovascular: Negative.   Gastrointestinal: Negative.   Endocrine: Negative.   Genitourinary: Negative.   Musculoskeletal: Negative.   Allergic/Immunologic: Negative.   Neurological: Negative.   Hematological: Negative.   Psychiatric/Behavioral: Positive for depression and dysphoric mood. The patient is nervous/anxious.    Physical Exam not done  Depressive Symptoms: depressed mood, anhedonia, feelings of worthlessness/guilt, difficulty concentrating, hopelessness, anxiety, panic attacks, loss of energy/fatigue,  (Hypo) Manic Symptoms:   Elevated Mood:  No Irritable Mood:  No Grandiosity:  No Distractibility:  No Labiality of Mood:  No Delusions:  No Hallucinations:  No Impulsivity:  No Sexually Inappropriate Behavior:  No Financial Extravagance:  No Flight of Ideas:  No  Anxiety Symptoms: Excessive Worry:  Yes Panic Symptoms:  Yes Agoraphobia:  No Obsessive Compulsive: No  Symptoms: None, Specific Phobias:  No Social Anxiety:  No  Psychotic Symptoms:  Hallucinations: No None Delusions:  No Paranoia:  No   Ideas of Reference:  No  PTSD Symptoms: Ever had a traumatic exposure:  No Had a traumatic exposure in the last month:  No Re-experiencing: No None Hypervigilance:  No Hyperarousal: No  None Avoidance: No None  Traumatic Brain Injury: No   Past Psychiatric History: Diagnosis: Depression, anxiety   Hospitalizations: none  Outpatient Care: Only through primary care   Substance Abuse Care: none  Self-Mutilation: none  Suicidal Attempts: none  Violent Behaviors: none   Past Medical History:   Past Medical History:  Diagnosis Date  . Depression    zoloft no help   . GAD (generalized anxiety disorder)   . Panic attacks   . Serotonin syndrome 09/17/2014   on paxil + wellbutrin   History of Loss of Consciousness:  No Seizure History:  No Cardiac History:  No Allergies:  No Known Allergies Current Medications:  Current Outpatient Prescriptions  Medication Sig Dispense Refill  . FLUoxetine (PROZAC) 20 MG capsule Take 3 capsules (60 mg total) by mouth every morning. 270 capsule 2  . clonazePAM (KLONOPIN) 1 MG tablet Take  1 tablet (1 mg total) by mouth daily as needed for anxiety. 20 tablet 0   No current facility-administered medications for this visit.     Previous Psychotropic Medications:  Medication Dose   Zoloft, Paxil, Wellbutrin, clonazepam                        Substance Abuse History in the last 12 months: Substance Age of 1st Use Last Use Amount Specific Type  Nicotine      Alcohol    drinks very occasionally    Cannabis      Opiates      Cocaine      Methamphetamines      LSD      Ecstasy      Benzodiazepines      Caffeine      Inhalants      Others:                          Medical Consequences of Substance Abuse: none  Legal Consequences of Substance Abuse: none  Family Consequences of Substance Abuse: none  Blackouts:  No DT's:  No Withdrawal Symptoms:  No None  Social History: Current Place of Residence: [place] [address] of Birth: [place] Family Members: Mother, stepfather, 2 brothers Marital Status:  Single Children:none Relationships: Currently not dating 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 Legal History: none Hobbies/Interests: Weightlifting and running  Family History:   Family History  Problem Relation Age of Onset  . Cancer Maternal Grandmother   . Cancer Maternal Grandfather   . Depression Mother   . Drug abuse Brother     Mental Status Examination/Evaluation: Objective:  Appearance: Casual, Neat and Well Groomed   Patent attorney::  Fair  Speech:  Clear and Coherent  Volume:  Normal  Mood:Very anxious   Affect  constricted And nervous   Thought Process:  Goal Directed  Orientation:  Full (Time, Place, and Person)  Thought Content:  Rumination  Suicidal Thoughts: None   Homicidal Thoughts:  No  Judgement:  Fair   Insight: Fair   Psychomotor Activity: Fidgety, can't sit still   Akathisia:  No  Handed:  Right  AIMS (if indicated):    Assets:  Communication Skills Desire for Improvement Physical Health Resilience Talents/Skills Vocational/Educational    Laboratory/X-Ray Psychological Evaluation(s)       Assessment:  Axis I: Major Depression, single episode  AXIS I Generalized Anxiety Disorder and Major Depression, single episode  AXIS II Deferred  AXIS III Past Medical History:  Diagnosis Date  . Depression    zoloft no help   . GAD (generalized anxiety disorder)   . Panic attacks   . Serotonin syndrome 09/17/2014   on paxil + wellbutrin     AXIS IV other psychosocial or environmental problems  AXIS V 41-50 serious symptoms   Treatment Plan/Recommendations:  Plan of Care: Medication management   Laboratory:    Psychotherapy: He will be scheduled to see Josh here   Medications: He will ContinueProzac 60 mg daily. He will start clonazepam 1 mg as needed only 20 have been given.   Routine PRN Medications:  No  Consultations:   Safety Concerns:  He denies  thoughts of harm to self or others   Other:  He will return in 4 weeks  Diannia Ruder, MD 9/27/20182:05 PM    Patient ID: Allen June. Celestin, male   DOB: February 27, 1971, 46 y.o.   MRN: [id number] Patient ID: Dionta Larke. Macpherson, male   DOB: May 18, 1971, 46 y.o.   MRN: [id number]

## 2017-09-14 ENCOUNTER — Ambulatory Visit (HOSPITAL_COMMUNITY): Payer: 59 | Admitting: Psychiatry

## 2017-09-22 ENCOUNTER — Ambulatory Visit (INDEPENDENT_AMBULATORY_CARE_PROVIDER_SITE_OTHER): Payer: 59 | Admitting: Licensed Clinical Social Worker

## 2017-09-22 DIAGNOSIS — F322 Major depressive disorder, single episode, severe without psychotic features: Secondary | ICD-10-CM | POA: Diagnosis not present

## 2017-09-22 DIAGNOSIS — F411 Generalized anxiety disorder: Secondary | ICD-10-CM
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
## 2017-10-06 ENCOUNTER — Ambulatory Visit (INDEPENDENT_AMBULATORY_CARE_PROVIDER_SITE_OTHER): Payer: 59 | Admitting: Psychiatry

## 2017-10-06 ENCOUNTER — Encounter (HOSPITAL_COMMUNITY): Payer: Self-pay | Admitting: Psychiatry

## 2017-10-06 VITALS — BP 112/83 | HR 72 | Ht 67.0 in | Wt 164.0 lb

## 2017-10-06 DIAGNOSIS — Z813 Family history of other psychoactive substance abuse and dependence: Secondary | ICD-10-CM

## 2017-10-06 DIAGNOSIS — Z818 Family history of other mental and behavioral disorders: Secondary | ICD-10-CM

## 2017-10-06 DIAGNOSIS — R450 Nervousness: Secondary | ICD-10-CM

## 2017-10-06 DIAGNOSIS — F322 Major depressive disorder, single episode, severe without psychotic features: Secondary | ICD-10-CM | POA: Diagnosis not present

## 2017-10-06 DIAGNOSIS — F411 Generalized anxiety disorder: Secondary | ICD-10-CM | POA: Diagnosis not present

## 2017-10-06 MED ORDER — CLONAZEPAM 1 MG PO TABS: 1.0000 mg | ORAL_TABLET | Freq: Every day | ORAL | 2 refills | Status: DC | PRN

## 2017-10-06 NOTE — Progress Notes (Signed)
BH MD/PA/NP OP Progress Note  10/06/2017 2:03 PM Allen Conley  MRN:  [id number]  Chief Complaint:  Chief Complaint    Depression; Anxiety; Follow-up     HPI:  Patient is a 46 year old white male who lives alone in [place]. He works for [employer].  The patient returns after 4 weeks. Last time he had returned after a long absence. He had been fortunately gotten on narcotics and eventually was being seen in his Suboxone clinic but detoxed himself. Last time he was very depressed and extremely anxious. He had been off his antidepressants and anti-anxiety medicine for several months. He admits that this was again because he had gotten into a relationship but at first everything was wonderful but then the woman involved decided it wasn't going to work out and withdrew. He states that after this happened he "came crashing down." This is happening numerous times before. Last visit we restarted his Prozac to 60 mg daily and he is feeling a little bit less depressed. The clonazepam 1 mg daily is helping his anxiety. I don't really want to give him anymore because he tends to overuse it. He is agreeable to staying with this dosage. He denies current thoughts of self-harm but is still isolating himself and withdrawing from people. I urged him to go back to the gym and see his friends. He has started therapy here with Sharia Reeve which is a good first step Visit Diagnosis:    ICD-10-CM   1. Major depressive disorder, single episode, severe without psychotic features (HCC) F32.2   2. Generalized anxiety disorder F41.1     Past Psychiatric History:  this patient is a 47 year old single white male lives alone in [place]. He works for [employer].  The patient was referred by his primary physician, Dr. Regino Schultze, for further assessment and treatment of depression and anxiety.  The patient states that his depression has been building over the last several years. As a baby his mother gave him up to his maternal grandmother  and she raised him off his life. When he turned about 75 years old his grandmother's health declined. Between the ages of 48 and 27 he did nothing but go to work school and take care of his grandmother. He claims he "didn't have a life". His grandmother died when he was 30 and this was very difficult for him. He moved in with his mother her husband and his brother but he can get along with them because little lifestyle there was noisy and chaotic.  About a year ago he moved out on his own. He was hoping things would improve but instead he's gotten increasingly depressed. He was dating a girl for a while and she moved away several months ago and he is been really on the bottom since then. He feels sad all the time, nothing makes him happy, his energy is low he's very anxious and has panic attacks and breakdowns he worries and ruminates about everything constantly He is sleeping and eating well. He denies overt suicidal ideation but feels like it wouldn't matter if his life ended. On the positive side he is very conscious about health and exercise, works out frequently and runs. He does not have any psychotic symptoms. He was drinking a lot until about 3 months ago but is cut it down considerably to 1 or 2 drinks every few weeks. He does not use drugs.  Dr. Diona Browner tried him on Zoloft, Paxil, then a combination of Paxil and Wellbutrin which caused severe  tremors. More recently he's been on lithium but couldn't tolerate it. He was on clonazepam which did not help his anxiety and now he is on Ativan. He doesn't seem to help but he waits until he is having a panic attack to take it  The patient returns after 10 months. He is actually dismissed from our practice for missing way too many appointments. Somehow he got back in. His family doctor saw him back in April and he admitted he had been using narcotics. His urine drug screen was positive for narcotics and cocaine along with benzodiazepines. The family doctor  gave him a prescription to wean off Xanax and suggested substance abuse treatment. He was going to a Suboxone clinic for a while in [place] and seeing a therapist there but has stopped going to this as well. He states for the last 2 months he's been excessively anxious shaky can't function can't think straight etc. He claims that the doctor giving him Suboxone also gave him up small amount of Xanax which doesn't make sense. I explained to him that since he was already dismissed we can take him back on a strict trial basis if he misses even one appointment he will be dismissed again and he agrees. I told him for now he needs to continue with Prozac and we can try a small dose of clonazepam but he will have to see our therapist here. As usual he's had a problem with the relationship and he usually gets extremely anxious and falls apart when relationships don't work out  Past Medical History:  Past Medical History:  Diagnosis Date  . Depression    zoloft no help   . GAD (generalized anxiety disorder)   . Panic attacks   . Serotonin syndrome 09/17/2014   on paxil + wellbutrin   No past surgical history on file.  Family Psychiatric History: See below   Family History:  Family History  Problem Relation Age of Onset  . Cancer Maternal Grandmother   . Cancer Maternal Grandfather   . Depression Mother   . Drug abuse Brother     Social History:  Social History   Social History  . Marital status: Single    Spouse name: N/A  . Number of children: N/A  . Years of education: N/A   Social History Main Topics  . Smoking status: Never Smoker  . Smokeless tobacco: Never Used  . Alcohol use No     Comment: socially  . Drug use: No  . Sexual activity: No   Other Topics Concern  . None   Social History Narrative   Single, no children.   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   No tobacco, occ alcohol (beer).   No hx of alc abuse or drug use.    Exercise: lifts weights 5 d/week, runs Sat/Sun    Allergies: No Known Allergies  Metabolic Disorder Labs: No results found for: HGBA1C, MPG No results found for: PROLACTIN No results found for: CHOL, TRIG, HDL, CHOLHDL, VLDL, LDLCALC Lab Results  Component Value Date   TSH 1.354 10/09/2014    Therapeutic Level Labs: Lab Results  Component Value Date   LITHIUM 0.20 (L) 10/18/2014   No results found for: VALPROATE No components found for:  CBMZ  Current Medications: Current Outpatient Prescriptions  Medication Sig Dispense Refill  . clonazePAM (KLONOPIN) 1 MG tablet Take 1 tablet (1 mg total) by mouth daily as  needed for anxiety. 30 tablet 2  . FLUoxetine (PROZAC) 20 MG capsule Take 3 capsules (60 mg total) by mouth every morning. 270 capsule 2   No current facility-administered medications for this visit.      Musculoskeletal: Strength & Muscle Tone: within normal limits Gait & Station: normal Patient leans: N/A  Psychiatric Specialty Exam: Review of Systems  Psychiatric/Behavioral: Positive for depression. The patient is nervous/anxious.   All other systems reviewed and are negative.   Blood pressure 112/83, pulse 72, height 5\' 7"  (1.702 m), weight 164 lb (74.4 kg).Body mass index is 25.69 kg/m.  General Appearance: Casual and Fairly Groomed  Eye Contact:  Good  Speech:  Clear and Coherent  Volume:  Normal  Mood:  Anxious  Affect:  Constricted  Thought Process:  Goal Directed  Orientation:  Full (Time, Place, and Person)  Thought Content: Rumination   Suicidal Thoughts:  No  Homicidal Thoughts:  No  Memory:  Immediate;   Good Recent;   Good Remote;   Good  Judgement:  Fair  Insight:  Fair  Psychomotor Activity:  Restlessness  Concentration:  Concentration: Fair and Attention Span: Fair  Recall:  Good  Fund of Knowledge: Good  Language: Good  Akathisia:  No  Handed:  Right  AIMS (if indicated): not done  Assets:  Communication Skills Desire for  Improvement Physical Health Resilience Social Support Talents/Skills  ADL's:  Intact  Cognition: WNL  Sleep:  Fair   Screenings: AIMS     [facility]  AIMS Total Score  0    AUDIT     Ad[facility] ([facility]  Alcohol Use Disorder Identification Test Final Score (AUDIT)  0       Assessment and Plan: The patient is a 46 year old white male with a long history of depression and anxiety. He has abused narcotics in the past but claims that he is now clean. He tends to be very obsessional particular about relationships. Prozac 60 mg seems to be helping and some of the symptoms are coming down. He will continue this medication as well as clonazepam 1 mg daily as needed for anxiety. He will continue his therapy with Josh and return to see me in 6 weeks   Diannia RuderOSS, DEBORAH, MD 10/06/2017, 2:03 PM

## 2017-12-19 ENCOUNTER — Other Ambulatory Visit (HOSPITAL_COMMUNITY): Payer: Self-pay | Admitting: Psychiatry

## 2017-12-20 ENCOUNTER — Ambulatory Visit (HOSPITAL_COMMUNITY): Payer: Self-pay | Admitting: Psychiatry

## 2018-08-11 ENCOUNTER — Ambulatory Visit (HOSPITAL_COMMUNITY): Payer: Self-pay | Admitting: Psychiatry

## 2018-08-18 ENCOUNTER — Encounter (HOSPITAL_COMMUNITY): Payer: Self-pay | Admitting: Psychiatry

## 2018-08-18 ENCOUNTER — Ambulatory Visit (INDEPENDENT_AMBULATORY_CARE_PROVIDER_SITE_OTHER): Payer: Self-pay | Admitting: Psychiatry

## 2018-08-18 VITALS — BP 118/80 | HR 71 | Ht 67.0 in | Wt 160.0 lb

## 2018-08-18 DIAGNOSIS — Z813 Family history of other psychoactive substance abuse and dependence: Secondary | ICD-10-CM

## 2018-08-18 DIAGNOSIS — R45851 Suicidal ideations: Secondary | ICD-10-CM

## 2018-08-18 DIAGNOSIS — F411 Generalized anxiety disorder: Secondary | ICD-10-CM

## 2018-08-18 DIAGNOSIS — Z818 Family history of other mental and behavioral disorders: Secondary | ICD-10-CM

## 2018-08-18 DIAGNOSIS — G4700 Insomnia, unspecified: Secondary | ICD-10-CM

## 2018-08-18 DIAGNOSIS — F322 Major depressive disorder, single episode, severe without psychotic features: Secondary | ICD-10-CM

## 2018-08-18 MED ORDER — ALPRAZOLAM 1 MG PO TABS: 1.0000 mg | ORAL_TABLET | Freq: Two times a day (BID) | ORAL | 2 refills | Status: DC

## 2018-08-18 MED ORDER — VENLAFAXINE HCL ER 75 MG PO CP24: ORAL_CAPSULE | ORAL | 2 refills | Status: DC

## 2018-08-18 NOTE — Progress Notes (Signed)
BH MD/PA/NP OP Progress Note  08/18/2018 10:28 AM Allen Conley  MRN:  [id number]  Chief Complaint:  Chief Complaint    Depression; Anxiety; Follow-up     HPI: This patient is a 47 year old single white male who lives alone in [place].  He is now working for a company that makes [employer].  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  The patient returns after long absence.  He was last seen 6 months ago.  He had been treated for a combination of depression and anxiety.  When I last saw him he was taking Prozac and clonazepam.  He states that about 6 weeks ago he went off the medications.  He states that the Prozac made him feel flat with no emotion and he just "did not care about anything."  He states that he was in a relationship that ended up few weeks ago and this is brought him down somewhat.  On the other hand he stated he and his girlfriend argued constantly and it was relieved to have it and.  He has found a new job that he likes better and he works days and has his evenings free.  However he feels sad and lonely when he goes home because he has no one in his life.  Since he stopped Prozac he has been extremely anxious.  He is having frequent anxiety attacks mood swings irritability crying spells and depression.  He has had some passive suicidal ideation but no plan to harm himself.  He has been on numerous antidepressants including Wellbutrin Prozac Paxil Cymbalta Viibryd.  He has not tried Effexor in the noradrenergic effect may help his alertness and focus.  He would like to go back on a medicine for anxiety and states Xanax worked the best but only wants to take 1 or 2 a day.  At one point he was abusing narcotics but has not done this anymore.   Visit Diagnosis:    ICD-10-CM   1. Major depressive disorder, single episode, severe without psychotic features (HCC) F32.2   2. Generalized anxiety disorder F41.1     Past Psychiatric History: Primarily outpatient treatment, previous  psychiatric hospitalization in 2017 for suicidal ideation  Past Medical History:  Past Medical History:  Diagnosis Date  . Depression    zoloft no help   . GAD (generalized anxiety disorder)   . Panic attacks   . Serotonin syndrome 09/17/2014   on paxil + wellbutrin   History reviewed. No pertinent surgical history.  Family Psychiatric History: See below  Family History:  Family History  Problem Relation Age of Onset  . Cancer Maternal Grandmother   . Cancer Maternal Grandfather   . Depression Mother   . Drug abuse Brother     Social History:  Social History   Socioeconomic History  . Marital status: Single    Spouse name: Not on file  . Number of children: Not on file  . Years of education: Not on file  . Highest education level: Not on file  Occupational History  . Not on file  Social Needs  . Financial resource strain: Not on file  . Food insecurity:    Worry: Not on file    Inability: Not on file  . Transportation needs:    Medical: Not on file    Non-medical: Not on file  Tobacco Use  . Smoking status: Never Smoker  . Smokeless tobacco: Never Used  Substance and Sexual Activity  . Alcohol use: No  Comment: socially  . Drug use: No    Types: "Crack" cocaine  . Sexual activity: Never  Lifestyle  . Physical activity:    Days per week: Not on file    Minutes per session: Not on file  . Stress: Not on file  Relationships  . Social connections:    Talks on phone: Not on file    Gets together: Not on file    Attends religious service: Not on file    Active member of club or organization: Not on file    Attends meetings of clubs or organizations: Not on file    Relationship status: Not on file  Other Topics Concern  . Not on file  Social History Narrative   Single, no children.   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   No tobacco, occ alcohol (beer).   No hx of alc abuse or drug use.   Exercise: lifts  weights 5 d/week, runs Sat/Sun    Allergies: No Known Allergies  Metabolic Disorder Labs: No results found for: HGBA1C, MPG No results found for: PROLACTIN No results found for: CHOL, TRIG, HDL, CHOLHDL, VLDL, LDLCALC Lab Results  Component Value Date   TSH 1.354 10/09/2014    Therapeutic Level Labs: Lab Results  Component Value Date   LITHIUM 0.20 (L) 10/18/2014   No results found for: VALPROATE No components found for:  CBMZ  Current Medications: Current Outpatient Medications  Medication Sig Dispense Refill  . ALPRAZolam (XANAX) 1 MG tablet Take 1 tablet (1 mg total) by mouth 2 (two) times daily. 60 tablet 2  . venlafaxine XR (EFFEXOR XR) 75 MG 24 hr capsule Take one qam for one week then increase to 2 qam 60 capsule 2   No current facility-administered medications for this visit.      Musculoskeletal: Strength & Muscle Tone: within normal limits Gait & Station: normal Patient leans: N/A  Psychiatric Specialty Exam: Review of Systems  Psychiatric/Behavioral: Positive for depression. The patient is nervous/anxious and has insomnia.   All other systems reviewed and are negative.   Blood pressure 118/80, pulse 71, height 5\' 7"  (1.702 m), weight 160 lb (72.6 kg), SpO2 100 %.Body mass index is 25.06 kg/m.  General Appearance: Casual and Fairly Groomed  Eye Contact:  Good  Speech:  Clear and Coherent  Volume:  Normal  Mood:  Anxious and Depressed  Affect:  Constricted and Depressed  Thought Process:  Goal Directed  Orientation:  Full (Time, Place, and Person)  Thought Content: Rumination   Suicidal Thoughts:  Yes.  without intent/plan  Homicidal Thoughts:  No  Memory:  Immediate;   Good Recent;   Good Remote;   Good  Judgement:  Fair  Insight:  Fair  Psychomotor Activity:  Restlessness  Concentration:  Concentration: Fair and Attention Span: Fair  Recall:  Good  Fund of Knowledge: Good  Language: Good  Akathisia:  No  Handed:  Right  AIMS (if  indicated): not done  Assets:  Communication Skills Desire for Improvement Physical Health Resilience Social Support Talents/Skills Vocational/Educational  ADL's:  Intact  Cognition: WNL  Sleep:  Poor   Screenings: AIMS     [facility]  AIMS Total Score  0    AUDIT     Ad[facility]  Alcohol Use Disorder Identification Test Final Score (AUDIT)  0       Assessment and Plan: This patient is a 47 year old male with a history of severe depression and anxiety.  There are some characterological components as well.  He does agree to go back into therapy here.  We will start Effexor XR 75 mg every morning for 1 week then advance to 150 mg every morning for depression.  He will restart Xanax 1 mg twice daily for anxiety.  He promises that he is not using any illicit drugs.  He will return to see me in 4 weeks   Diannia Ruder, MD 08/18/2018, 10:28 AM

## 2018-08-29 ENCOUNTER — Ambulatory Visit (HOSPITAL_COMMUNITY): Payer: Self-pay | Admitting: Licensed Clinical Social Worker

## 2018-09-18 ENCOUNTER — Encounter (HOSPITAL_COMMUNITY): Payer: Self-pay | Admitting: Psychiatry

## 2018-09-18 ENCOUNTER — Ambulatory Visit (INDEPENDENT_AMBULATORY_CARE_PROVIDER_SITE_OTHER): Payer: Self-pay | Admitting: Psychiatry

## 2018-09-18 VITALS — BP 113/76 | HR 91 | Ht 67.0 in | Wt 156.0 lb

## 2018-09-18 DIAGNOSIS — F411 Generalized anxiety disorder: Secondary | ICD-10-CM

## 2018-09-18 DIAGNOSIS — G4700 Insomnia, unspecified: Secondary | ICD-10-CM

## 2018-09-18 DIAGNOSIS — F322 Major depressive disorder, single episode, severe without psychotic features: Secondary | ICD-10-CM

## 2018-09-18 MED ORDER — ALPRAZOLAM 1 MG PO TABS: 1.0000 mg | ORAL_TABLET | Freq: Two times a day (BID) | ORAL | 2 refills | Status: DC

## 2018-09-18 MED ORDER — VENLAFAXINE HCL ER 150 MG PO CP24: 300.0000 mg | ORAL_CAPSULE | Freq: Every day | ORAL | 2 refills | Status: DC

## 2018-09-18 MED ORDER — ARIPIPRAZOLE 2 MG PO TABS: 2.0000 mg | ORAL_TABLET | Freq: Every day | ORAL | 2 refills | Status: DC

## 2018-09-18 NOTE — Progress Notes (Signed)
BH MD/PA/NP OP Progress Note  09/18/2018 4:20 PM Allen Conley. [place]  MRN:  [id number]  Chief Complaint:  Chief Complaint    Depression; Anxiety; Follow-up     HPI: This patient is a 47 year old single white male who lives alone in [place].  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  The patient returns after 4 weeks for treatment of depression and anxiety.  Last time he had been off Prozac for quite some time and he was getting increasingly depressed and anxious.  A lot of this had to do with a recent break-up with a girlfriend.  We started Effexor XR and he is now up to 150 mg daily.  He still feels like he is not doing that much better.  He is slightly less depressed but last weekend he had a "major breakdown" he felt alone and like nothing was worth it and nothing was there for him in the world.  He cried most of the weekend.  A good friend came and spent time with him which was helpful.  He states that he gets in a right of thinking negative things about himself and cannot get out of it.  He is not scheduled for any counseling here and we need to get this going as soon as possible.  He is able to function fairly well at work.  When he is down time however he feels hopeless and thinks that no one will ever love him again.  I offered to put him in the intensive outpatient program and is going to think about it.  He denies thoughts of self-harm or suicide.  He is not using drugs or alcohol.  I suggested we go up on the Effexor XR to 300 mg daily and also add Abilify for augmentation Visit Diagnosis:    ICD-10-CM   1. Major depressive disorder, single episode, severe without psychotic features (HCC) F32.2   2. Generalized anxiety disorder F41.1     Past Psychiatric History: Primarily outpatient treatment, previous psychiatric hospitalization in 2017 for suicidal ideation  Past Medical History:  Past Medical History:  Diagnosis Date  .  Depression    zoloft no help   . GAD (generalized anxiety disorder)   . Panic attacks   . Serotonin syndrome 09/17/2014   on paxil + wellbutrin   History reviewed. No pertinent surgical history.  Family Psychiatric History: See below  Family History:  Family History  Problem Relation Age of Onset  . Cancer Maternal Grandmother   . Cancer Maternal Grandfather   . Depression Mother   . Drug abuse Brother     Social History:  Social History   Socioeconomic History  . Marital status: Single    Spouse name: Not on file  . Number of children: Not on file  . Years of education: Not on file  . Highest education level: Not on file  Occupational History  . Not on file  Social Needs  . Financial resource strain: Not on file  . Food insecurity:    Worry: Not on file    Inability: Not on file  . Transportation needs:    Medical: Not on file    Non-medical: Not on file  Tobacco Use  . Smoking status: Never Smoker  . Smokeless tobacco: Never Used  Substance and Sexual Activity  . Alcohol use: No    Comment: socially  . Drug use: No    Types: "Crack" cocaine  .  Sexual activity: Never  Lifestyle  . Physical activity:    Days per week: Not on file    Minutes per session: Not on file  . Stress: Not on file  Relationships  . Social connections:    Talks on phone: Not on file    Gets together: Not on file    Attends religious service: Not on file    Active member of club or organization: Not on file    Attends meetings of clubs or organizations: Not on file    Relationship status: Not on file  Other Topics Concern  . Not on file  Social History Narrative   Single, no children.   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   No tobacco, occ alcohol (beer).   No hx of alc abuse or drug use.   Exercise: lifts weights 5 d/week, runs Sat/Sun    Allergies: No Known Allergies  Metabolic Disorder Labs: No results found for: HGBA1C,  MPG No results found for: PROLACTIN No results found for: CHOL, TRIG, HDL, CHOLHDL, VLDL, LDLCALC Lab Results  Component Value Date   TSH 1.354 10/09/2014    Therapeutic Level Labs: Lab Results  Component Value Date   LITHIUM 0.20 (L) 10/18/2014   No results found for: VALPROATE No components found for:  CBMZ  Current Medications: Current Outpatient Medications  Medication Sig Dispense Refill  . ALPRAZolam (XANAX) 1 MG tablet Take 1 tablet (1 mg total) by mouth 2 (two) times daily. 60 tablet 2  . ARIPiprazole (ABILIFY) 2 MG tablet Take 1 tablet (2 mg total) by mouth daily. 30 tablet 2  . venlafaxine XR (EFFEXOR-XR) 150 MG 24 hr capsule Take 2 capsules (300 mg total) by mouth daily with breakfast. 60 capsule 2   No current facility-administered medications for this visit.      Musculoskeletal: Strength & Muscle Tone: within normal limits Gait & Station: normal Patient leans: N/A  Psychiatric Specialty Exam: Review of Systems  Constitutional: Positive for malaise/fatigue and weight loss.  Psychiatric/Behavioral: Positive for depression. The patient is nervous/anxious and has insomnia.   All other systems reviewed and are negative.   Blood pressure 113/76, pulse 91, height 5\' 7"  (1.702 m), weight 156 lb (70.8 kg), SpO2 99 %.Body mass index is 24.43 kg/m.  General Appearance: Casual and Fairly Groomed  Eye Contact:  Good  Speech:  Clear and Coherent  Volume:  Normal  Mood:  Depressed and Hopeless  Affect:  Constricted and Depressed  Thought Process:  Goal Directed  Orientation:  Full (Time, Place, and Person)  Thought Content: Obsessions and Rumination   Suicidal Thoughts:  No  Homicidal Thoughts:  No  Memory:  Immediate;   Good Recent;   Good Remote;   Good  Judgement:  Poor  Insight:  Lacking  Psychomotor Activity:  Decreased  Concentration:  Concentration: Poor and Attention Span: Poor  Recall:  Good  Fund of Knowledge: Good  Language: Good  Akathisia:  No   Handed:  Right  AIMS (if indicated): not done  Assets:  Communication Skills Desire for Improvement Physical Health Resilience Social Support Talents/Skills  ADL's:  Intact  Cognition: WNL  Sleep:  Poor   Screenings: AIMS     [facility]  AIMS Total Score  0    AUDIT     Ad[facility]) [facility]  Alcohol Use Disorder Identification Test Final Score (AUDIT)  0       Assessment and Plan: This patient is a 47 year old male with a history of depression anxiety and very low self-esteem.  He tends to be obsessional in a negative way.  He desperately needs counseling and will try to get him in here as soon as we can or refer him to IOP.  He will increase Effexor XR to 300 mg daily for depression and add Abilify 2 mg daily for mood stabilization and augmentation.  He will continue Xanax 1 mg twice a day as needed for anxiety.  He will return to see me in 3 weeks   Diannia Ruder, MD 09/18/2018, 4:20 PM

## 2018-10-09 ENCOUNTER — Ambulatory Visit (HOSPITAL_COMMUNITY): Payer: Self-pay | Admitting: Psychiatry

## 2018-11-01 ENCOUNTER — Encounter: Payer: Self-pay | Admitting: *Deleted

## 2018-11-20 ENCOUNTER — Encounter

## 2018-11-20 ENCOUNTER — Ambulatory Visit (HOSPITAL_COMMUNITY): Payer: Self-pay | Admitting: Licensed Clinical Social Worker

## 2018-12-28 ENCOUNTER — Other Ambulatory Visit (HOSPITAL_COMMUNITY): Payer: Self-pay | Admitting: Psychiatry

## 2019-02-15 ENCOUNTER — Encounter (HOSPITAL_COMMUNITY): Payer: Self-pay | Admitting: Psychiatry

## 2019-02-15 ENCOUNTER — Ambulatory Visit (INDEPENDENT_AMBULATORY_CARE_PROVIDER_SITE_OTHER): Payer: Self-pay | Admitting: Psychiatry

## 2019-02-15 VITALS — BP 111/71 | HR 90 | Ht 67.0 in | Wt 159.0 lb

## 2019-02-15 DIAGNOSIS — F411 Generalized anxiety disorder: Secondary | ICD-10-CM

## 2019-02-15 DIAGNOSIS — F322 Major depressive disorder, single episode, severe without psychotic features: Secondary | ICD-10-CM

## 2019-02-15 MED ORDER — ALPRAZOLAM 1 MG PO TABS
1.0000 mg | ORAL_TABLET | Freq: Three times a day (TID) | ORAL | 2 refills | Status: DC
Start: 2019-02-15 — End: 2019-05-04

## 2019-02-15 MED ORDER — VENLAFAXINE HCL ER 150 MG PO CP24
300.0000 mg | ORAL_CAPSULE | Freq: Every day | ORAL | 2 refills | Status: DC
Start: 2019-02-15 — End: 2019-06-13

## 2019-02-15 MED ORDER — ARIPIPRAZOLE 2 MG PO TABS: 2.0000 mg | ORAL_TABLET | Freq: Every day | ORAL | 2 refills | Status: DC

## 2019-02-15 NOTE — Progress Notes (Signed)
BH MD/PA/NP OP Progress Note  02/15/2019 2:07 PM Allen Conley  MRN:  [id number]  Chief Complaint:  Chief Complaint    Anxiety; Depression; Follow-up     HPI: This patient is a 48 year old single white male who lives alone in [place].  He is currently unemployed but starting a new job next week.  The patient returns after long absence.  He was last seen here in October.  At that time he was very depressed and had just gone through a break-up with a girlfriend.  He was supposed to come back fairly soon but never made it back but claims he had been doing better.  He states he combination of Effexor XR and Abilify are helping with his depression.  He recently has gone back to the gym and has started working out.  He states however his anxiety is very severe and he would like to know if he can have 1 more Xanax per day in the evening and I think this is reasonable as long as he does not use more than 3/day.  He denies thoughts of self-harm or suicide. Visit Diagnosis:    ICD-10-CM   1. Major depressive disorder, single episode, severe without psychotic features (HCC) F32.2   2. Generalized anxiety disorder F41.1     Past Psychiatric History: Primarily outpatient treatment, previous psychiatric hospitalization in 2017 for suicidal ideation  Past Medical History:  Past Medical History:  Diagnosis Date  . Depression    zoloft no help   . GAD (generalized anxiety disorder)   . Panic attacks   . Serotonin syndrome 09/17/2014   on paxil + wellbutrin   History reviewed. No pertinent surgical history.  Family Psychiatric History: See below  Family History:  Family History  Problem Relation Age of Onset  . Cancer Maternal Grandmother   . Cancer Maternal Grandfather   . Depression Mother   . Drug abuse Brother     Social History:  Social History   Socioeconomic History  . Marital status: Single    Spouse name: Not on file  . Number of children: Not on file  . Years of education: Not  on file  . Highest education level: Not on file  Occupational History  . Not on file  Social Needs  . Financial resource strain: Not on file  . Food insecurity:    Worry: Not on file    Inability: Not on file  . Transportation needs:    Medical: Not on file    Non-medical: Not on file  Tobacco Use  . Smoking status: Never Smoker  . Smokeless tobacco: Never Used  Substance and Sexual Activity  . Alcohol use: No    Comment: socially  . Drug use: No    Types: "Crack" cocaine  . Sexual activity: Never  Lifestyle  . Physical activity:    Days per week: Not on file    Minutes per session: Not on file  . Stress: Not on file  Relationships  . Social connections:    Talks on phone: Not on file    Gets together: Not on file    Attends religious service: Not on file    Active member of club or organization: Not on file    Attends meetings of clubs or organizations: Not on file    Relationship status: Not on file  Other Topics Concern  . Not on file  Social History Narrative   Single, no children.   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  OCC: Regulatory affairs officer for [employer]   Lives in [place], [place].   No tobacco, occ alcohol (beer).   No hx of alc abuse or drug use.   Exercise: lifts weights 5 d/week, runs Sat/Sun    Allergies: No Known Allergies  Metabolic Disorder Labs: No results found for: HGBA1C, MPG No results found for: PROLACTIN No results found for: CHOL, TRIG, HDL, CHOLHDL, VLDL, LDLCALC Lab Results  Component Value Date   TSH 1.354 10/09/2014    Therapeutic Level Labs: Lab Results  Component Value Date   LITHIUM 0.20 (L) 10/18/2014   No results found for: VALPROATE No components found for:  CBMZ  Current Medications: Current Outpatient Medications  Medication Sig Dispense Refill  . ALPRAZolam (XANAX) 1 MG tablet Take 1 tablet (1 mg total) by mouth 3 (three) times daily. 90 tablet 2  . ARIPiprazole (ABILIFY) 2 MG tablet Take 1 tablet (2 mg total) by mouth daily. 30  tablet 2  . venlafaxine XR (EFFEXOR-XR) 150 MG 24 hr capsule Take 2 capsules (300 mg total) by mouth daily with breakfast. 60 capsule 2   No current facility-administered medications for this visit.      Musculoskeletal: Strength & Muscle Tone: within normal limits Gait & Station: normal Patient leans: N/A  Psychiatric Specialty Exam: Review of Systems  Psychiatric/Behavioral: The patient is nervous/anxious.   All other systems reviewed and are negative.   Blood pressure 111/71, pulse 90, height 5\' 7"  (1.702 m), weight 159 lb (72.1 kg), SpO2 99 %.Body mass index is 24.9 kg/m.  General Appearance: Casual and Fairly Groomed  Eye Contact:  Good  Speech:  Clear and Coherent  Volume:  Normal  Mood:  Anxious  Affect:  Appropriate and Congruent  Thought Process:  Goal Directed  Orientation:  Full (Time, Place, and Person)  Thought Content: Rumination   Suicidal Thoughts:  No  Homicidal Thoughts:  No  Memory:  Immediate;   Good Recent;   Good Remote;   Fair  Judgement:  Fair  Insight:  Fair  Psychomotor Activity:  Normal  Concentration:  Concentration: Fair and Attention Span: Fair  Recall:  Good  Fund of Knowledge: Good  Language: Good  Akathisia:  No  Handed:  Right  AIMS (if indicated): not done  Assets:  Communication Skills Desire for Improvement Physical Health Resilience Social Support Talents/Skills  ADL's:  Intact  Cognition: WNL  Sleep:  Fair   Screenings: AIMS     [facility]  AIMS Total Score  0    AUDIT     [facility]  Alcohol Use Disorder Identification Test Final Score (AUDIT)  0       Assessment and Plan: This patient is a 48 year old male with a long-term history of depression and anxiety.  He continues to do well in terms of depression but has been having more anxiety recently.  He will  continue Effexor XR 300 mg every morning for depression, Abilify 2 mg daily for augmentation and Xanax 1 mg will be increased to 3 times daily for anxiety.  He will return to see me in 2 months   Diannia Ruder, MD 02/15/2019, 2:07 PM

## 2019-04-17 ENCOUNTER — Ambulatory Visit (HOSPITAL_COMMUNITY): Payer: Self-pay | Admitting: Psychiatry

## 2019-04-17 ENCOUNTER — Other Ambulatory Visit: Payer: Self-pay

## 2019-05-04 ENCOUNTER — Other Ambulatory Visit (HOSPITAL_COMMUNITY): Payer: Self-pay | Admitting: Psychiatry

## 2019-05-04 NOTE — Telephone Encounter (Signed)
Call pt for appt 

## 2019-06-13 ENCOUNTER — Other Ambulatory Visit (HOSPITAL_COMMUNITY): Payer: Self-pay | Admitting: Psychiatry

## 2019-06-13 NOTE — Telephone Encounter (Signed)
Call patient for f/u

## 2019-06-13 NOTE — Telephone Encounter (Signed)
Attempted Call  Unable to reach : Ver[employer]ion recording this phone has calling restrictions

## 2019-08-04 ENCOUNTER — Other Ambulatory Visit (HOSPITAL_COMMUNITY): Payer: Self-pay | Admitting: Psychiatry

## 2019-08-06 NOTE — Telephone Encounter (Signed)
Send discharge letter .

## 2019-08-06 NOTE — Telephone Encounter (Signed)
Attempted call per Provider: Call pt for f/u. He has missed several appointments, if he misses again he will be discharged.  *Verizion wireless message: the # has been disconnected or no longer in service.

## 2019-08-06 NOTE — Telephone Encounter (Signed)
Call pt for f/u. He has missed several appointments, if he misses again he will be discharged

## 2019-08-15 ENCOUNTER — Other Ambulatory Visit (HOSPITAL_COMMUNITY): Payer: Self-pay | Admitting: Psychiatry

## 2019-10-18 ENCOUNTER — Other Ambulatory Visit (HOSPITAL_COMMUNITY): Payer: Self-pay | Admitting: Psychiatry

## 2019-11-06 ENCOUNTER — Encounter: Payer: Self-pay | Admitting: Family Medicine

## 2019-11-06 ENCOUNTER — Other Ambulatory Visit: Payer: Self-pay

## 2019-11-06 NOTE — Progress Notes (Deleted)
Virtual Visit via Video Note  I connected with pt on 11/06/19 at 11:00 AM EST by a video enabled telemedicine application and verified that I am speaking with the correct person using two identifiers.  Location patient: home Location provider:work or home office Persons participating in the virtual visit: patient, provider  I discussed the limitations of evaluation and management by telemedicine and the availability of in person appointments. The patient expressed understanding and agreed to proceed.  Telemedicine visit is a necessity given the COVID-19 restrictions in place at the current time.  HPI: 48 y/o WM being seen today for anxiety and depression. In review of EMR, he was managed by [facility], Dr. Harrington Challenger, with most recent visit documented 02/15/19. I have not seen him since 02/2017, at which time he had been dismissed from [facility] for too many missed appts. He had been self-medicating with percocet at that time.  I ordered referral to [facility] in [place] for psychiatrist to consider detox/ween + ongoing tx for opioid addiction.  I continued him on buspar and prozac at that time but declined to rx xanax. UDS 03/25/19 showed cocaine, oxycodone, and alprazolam  I told him I would never rx him controlled substances.    ROS: See pertinent positives and negatives per HPI.  Past Medical History:  Diagnosis Date  . Depression    zoloft no help   . GAD (generalized anxiety disorder)   . Panic attacks   . Serotonin syndrome 09/17/2014   on paxil + wellbutrin    No past surgical history on file.  Family History  Problem Relation Age of Onset  . Cancer Maternal Grandmother   . Cancer Maternal Grandfather   . Depression Mother   . Drug abuse Brother      Current Outpatient Medications:  .  ALPRAZolam (XANAX) 1 MG tablet, TAKE 1 TABLET BY MOUTH THREE TIMES DAILY, Disp: 90 tablet, Rfl: 2 .  ARIPiprazole (ABILIFY) 2 MG tablet, TAKE 1 TABLET BY MOUTH DAILY, Disp: 30 tablet, Rfl: 2 .  venlafaxine XR  (EFFEXOR-XR) 150 MG 24 hr capsule, TAKE TWO CAPSULES BY MOUTH DAILY WITH BREAKFAST, Disp: 60 capsule, Rfl: 2  EXAM:  VITALS per patient if applicable: There were no vitals taken for this visit.   GENERAL: alert, oriented, appears well and in no acute distress  HEENT: atraumatic, conjunttiva clear, no obvious abnormalities on inspection of external nose and ears  NECK: normal movements of the head and neck  LUNGS: on inspection no signs of respiratory distress, breathing rate appears normal, no obvious gross SOB, gasping or wheezing  CV: no obvious cyanosis  MS: moves all visible extremities without noticeable abnormality  PSYCH/NEURO: pleasant and cooperative, no obvious depression or anxiety, speech and thought processing grossly intact  ASSESSMENT AND PLAN:  Discussed the following assessment and plan:  No diagnosis found.  -we discussed possible serious and likely etiologies, options for evaluation and workup, limitations of telemedicine visit vs in person visit, treatment, treatment risks and precautions. Pt prefers to treat via telemedicine empirically rather then risking or undertaking an in person visit at this moment. Patient agrees to seek prompt in person care if worsening, new symptoms arise, or if is not improving with treatment.   I discussed the assessment and treatment plan with the patient. The patient was provided an opportunity to ask questions and all were answered. The patient agreed with the plan and demonstrated an understanding of the instructions.   The patient was advised to call back or seek an  in-person evaluation if the symptoms worsen or if the condition fails to improve as anticipated.  F/u: ***  Signed:  Santiago Bumpers, MD           11/06/2019

## 2019-11-07 ENCOUNTER — Ambulatory Visit (INDEPENDENT_AMBULATORY_CARE_PROVIDER_SITE_OTHER): Payer: Self-pay | Admitting: Family Medicine

## 2019-11-07 ENCOUNTER — Encounter: Payer: Self-pay | Admitting: Family Medicine

## 2019-11-07 DIAGNOSIS — F330 Major depressive disorder, recurrent, mild: Secondary | ICD-10-CM

## 2019-11-07 DIAGNOSIS — F411 Generalized anxiety disorder: Secondary | ICD-10-CM

## 2019-11-07 MED ORDER — VENLAFAXINE HCL ER 150 MG PO CP24: ORAL_CAPSULE | ORAL | 2 refills | Status: DC

## 2019-11-07 MED ORDER — ARIPIPRAZOLE 2 MG PO TABS: 2.0000 mg | ORAL_TABLET | Freq: Every day | ORAL | 2 refills | Status: DC

## 2019-11-07 NOTE — Progress Notes (Signed)
Virtual Visit via Video Note  I connected with pt on 11/07/19 at  2:00 PM EST by a video enabled telemedicine application and verified that I am speaking with the correct person using two identifiers.  Location patient: home Location provider:work or home office Persons participating in the virtual visit: patient, provider  I discussed the limitations of evaluation and management by telemedicine and the availability of in person appointments. The patient expressed understanding and agreed to proceed.  Telemedicine visit is a necessity given the COVID-19 restrictions in place at the current time.  HPI: 48 y/o WM being seen today for MDD, GAD, hx of opioid abuse/dependence. Was seeing Dr. Diannia Ruder with [facility] up until 02/15/19, was doing better per her note on 02/15/19, was continued on effexor xr 300 qd, abilify 2mg  qd, and his alprazolam 1mg  was increased to 3 times a day prn.  I last saw him 03/08/17, at which time he was abusing oxycodone ("off the streets"). UDS at that time showed cocaine as well as oxycodone and alpraz. I told him I would no longer rx him controlled substances and I referred him to psychiatrist at that time. He apparently did get tx at a suboxone clinic after that but then re-established with [facility] caregivers 08/2017. His compliance with psych f/u has always been poor.  Interim hx: Doing ok. Working at [employer] in [place], plans routes for drivers. Lost to f/u with Dr. [employer] due to loss of insurance but has this back now with new job. Lives alone in [place].  No alcohol or drug use. Has been out of effexor and abilify the last 2 wks. Mild dip in mood and mild inc anxiety since being off meds last 2 wks. Not in a relationship currently.  ROS: See pertinent positives and negatives per HPI.  Past Medical History:  Diagnosis Date  . Depression    zoloft no help   . GAD (generalized anxiety disorder)   . Opioid abuse (HCC) 2018  . Panic attacks   . Serotonin syndrome  09/17/2014   on paxil + wellbutrin    No past surgical history on file.  Family History  Problem Relation Age of Onset  . Cancer Maternal Grandmother   . Cancer Maternal Grandfather   . Depression Mother   . Drug abuse Brother     SOCIAL HX:  Social History   Socioeconomic History  . Marital status: Single    Spouse name: Not on file  . Number of children: Not on file  . Years of education: Not on file  . Highest education level: Not on file  Occupational History  . Not on file  Social Needs  . Financial resource strain: Not on file  . Food insecurity    Worry: Not on file    Inability: Not on file  . Transportation needs    Medical: Not on file    Non-medical: Not on file  Tobacco Use  . Smoking status: Never Smoker  . Smokeless tobacco: Never Used  Substance and Sexual Activity  . Alcohol use: No    Comment: socially  . Drug use: No    Types: "Crack" cocaine  . Sexual activity: Never  Lifestyle  . Physical activity    Days per week: Not on file    Minutes per session: Not on file  . Stress: Not on file  Relationships  . Social 2019 on phone: Not on file    Gets together: Not on file  Attends religious service: Not on file    Active member of club or organization: Not on file    Attends meetings of clubs or organizations: Not on file    Relationship status: Not on file  Other Topics Concern  . Not on file  Social History Narrative   Single, no children.   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   OCC: Editor, commissioning for [employer]   Lives in [place], [place].   No tobacco, occ alcohol (beer).   No hx of alc abuse or drug use.   Exercise: lifts weights 5 d/week, runs Sat/Sun      Current Outpatient Medications:  .  ALPRAZolam (XANAX) 1 MG tablet, TAKE 1 TABLET BY MOUTH THREE TIMES DAILY (Patient not taking: Reported on 11/06/2019), Disp: 90 tablet, Rfl: 2 .  ARIPiprazole (ABILIFY) 2 MG tablet, TAKE 1 TABLET BY MOUTH DAILY (Patient not taking: Reported on  11/06/2019), Disp: 30 tablet, Rfl: 2 .  venlafaxine XR (EFFEXOR-XR) 150 MG 24 hr capsule, TAKE TWO CAPSULES BY MOUTH DAILY WITH BREAKFAST (Patient not taking: Reported on 11/06/2019), Disp: 60 capsule, Rfl: 2  EXAM:  VITALS per patient if applicable: There were no vitals taken for this visit.   GENERAL: alert, oriented, appears well and in no acute distress  HEENT: atraumatic, conjunttiva clear, no obvious abnormalities on inspection of external nose and ears  NECK: normal movements of the head and neck  LUNGS: on inspection no signs of respiratory distress, breathing rate appears normal, no obvious gross SOB, gasping or wheezing  CV: no obvious cyanosis  MS: moves all visible extremities without noticeable abnormality  PSYCH/NEURO: pleasant and cooperative, no obvious depression or anxiety, speech and thought processing grossly intact  LABS: none today  Lab Results  Component Value Date   TSH 1.354 10/09/2014   Lab Results  Component Value Date   WBC 4.8 12/23/2015   HGB 15.2 12/23/2015   HCT 46.7 12/23/2015   MCV 95.7 12/23/2015   PLT 261 12/23/2015   Lab Results  Component Value Date   CREATININE 1.05 12/23/2015   BUN 22 (H) 12/23/2015   NA 141 12/23/2015   K 4.9 12/23/2015   CL 105 12/23/2015   CO2 31 12/23/2015   Lab Results  Component Value Date   ALT 18 10/09/2014   AST 30 10/09/2014   ALKPHOS 72 10/09/2014   BILITOT 0.4 10/09/2014   ASSESSMENT AND PLAN:  Discussed the following assessment and plan:  1) Recurrent MDD + GAD. Has been doing well on effexor xr 300 mg qd and abilify 2mg  qd. Needs to get back on meds after running out 2 wks ago. Recommended restart of effexor xr at ONE of the 150mg  tabs daily x 4-5d before resuming the TWO tabs per day he has been on long term. He is not interested in counseling and he feels like he doesn't need to be seen by a psychiatrist anymore. We agreed that I'll follow him up and manage his meds: needs q3 mo f/u  long term.   I discussed the assessment and treatment plan with the patient. The patient was provided an opportunity to ask questions and all were answered. The patient agreed with the plan and demonstrated an understanding of the instructions.   The patient was advised to call back or seek an in-person evaluation if the symptoms worsen or if the condition fails to improve as anticipated.  F/u: 3 mo  Signed:  Crissie Sickles, MD  11/07/2019  

## 2020-01-29 ENCOUNTER — Other Ambulatory Visit: Payer: Self-pay | Admitting: Family Medicine

## 2020-01-29 NOTE — Telephone Encounter (Signed)
Pt is due for 3 month f/u. Tried to contact patient but unavailable and unable to leave message.

## 2020-04-09 ENCOUNTER — Other Ambulatory Visit: Payer: Self-pay | Admitting: Family Medicine

## 2020-04-15 ENCOUNTER — Telehealth: Payer: Self-pay

## 2020-04-15 NOTE — Telephone Encounter (Signed)
Patient's mother called back, I advised her to have patient call as he has a DPR that states we are not to release his medical information with anyone.  She asked that we please call him at [phone] to try to get him scheduled as he needs his medication.

## 2020-04-15 NOTE — Telephone Encounter (Signed)
LM for pt to returncall

## 2020-04-15 NOTE — Telephone Encounter (Addendum)
Patient's mother left voicemail yesterday to request refills for aripriprazole and venlafaxine. Last med request received 04/09/20 and denied due to patient being overdue for appt. His last appt was 11/07/19 and due to f/u 3 months, February. Will call pt to notify

## 2020-04-16 ENCOUNTER — Other Ambulatory Visit: Payer: Self-pay

## 2020-04-16 MED ORDER — ARIPIPRAZOLE 2 MG PO TABS: 2.0000 mg | ORAL_TABLET | Freq: Every day | ORAL | 0 refills | Status: DC

## 2020-04-16 MED ORDER — VENLAFAXINE HCL ER 150 MG PO CP24: ORAL_CAPSULE | ORAL | 0 refills | Status: DC

## 2020-04-16 NOTE — Telephone Encounter (Signed)
LMOM for pt to CB. Appt needed for refills.

## 2020-04-16 NOTE — Telephone Encounter (Signed)
Patient scheduled appt. 7 day supply sent until then.

## 2020-04-22 ENCOUNTER — Ambulatory Visit: Payer: Self-pay | Admitting: Family Medicine

## 2020-04-22 DIAGNOSIS — Z0289 Encounter for other administrative examinations: Secondary | ICD-10-CM

## 2020-04-22 NOTE — Progress Notes (Deleted)
OFFICE VISIT  04/22/2020   CC: No chief complaint on file.  HPI:    Patient is a 49 y.o. Caucasian male who presents for 6 mo f/u recurrent MDD and GAD/panic. A/P as of last visit: ") Recurrent MDD + GAD. Has been doing well on effexor xr 300 mg qd and abilify 2mg  qd. Needs to get back on meds after running out 2 wks ago. Recommended restart of effexor xr at ONE of the 150mg  tabs daily x 4-5d before resuming the TWO tabs per day he has been on long term. He is not interested in counseling and he feels like he doesn't need to be seen by a psychiatrist anymore. We agreed that I'll follow him up and manage his meds: needs q3 mo f/u long term."  INTERIM HX: *** Has history of polysubstance abuse->suboxone clinic in the past.   Past Medical History:  Diagnosis Date  . Depression    zoloft no help   . GAD (generalized anxiety disorder)   . Opioid abuse (HCC) 2018  . Panic attacks   . Serotonin syndrome 09/17/2014   on paxil + wellbutrin    No past surgical history on file.  Outpatient Medications Prior to Visit  Medication Sig Dispense Refill  . ARIPiprazole (ABILIFY) 2 MG tablet Take 1 tablet (2 mg total) by mouth daily. 7 tablet 0  . venlafaxine XR (EFFEXOR-XR) 150 MG 24 hr capsule TAKE TWO CAPSULES BY MOUTH EVERY DAY WITH BREAKFAST 14 capsule 0   No facility-administered medications prior to visit.    No Known Allergies  ROS As per HPI  PE: There were no vitals taken for this visit. ***  LABS:  Lab Results  Component Value Date   TSH 1.354 10/09/2014   Lab Results  Component Value Date   WBC 4.8 12/23/2015   HGB 15.2 12/23/2015   HCT 46.7 12/23/2015   MCV 95.7 12/23/2015   PLT 261 12/23/2015   Lab Results  Component Value Date   CREATININE 1.05 12/23/2015   BUN 22 (H) 12/23/2015   NA 141 12/23/2015   K 4.9 12/23/2015   CL 105 12/23/2015   CO2 31 12/23/2015   Lab Results  Component Value Date   ALT 18 10/09/2014   AST 30 10/09/2014   ALKPHOS 72  10/09/2014   BILITOT 0.4 10/09/2014   IMPRESSION AND PLAN:  No problem-specific Assessment & Plan notes found for this encounter.   An After Visit Summary was printed and given to the patient.  FOLLOW UP: No follow-ups on file.  Signed:  10/11/2014, MD           04/22/2020

## 2020-04-23 ENCOUNTER — Telehealth: Payer: Self-pay

## 2020-04-23 ENCOUNTER — Other Ambulatory Visit: Payer: Self-pay

## 2020-04-23 MED ORDER — VENLAFAXINE HCL ER 150 MG PO CP24: ORAL_CAPSULE | ORAL | 0 refills | Status: DC

## 2020-04-23 MED ORDER — ARIPIPRAZOLE 2 MG PO TABS: 2.0000 mg | ORAL_TABLET | Freq: Every day | ORAL | 0 refills | Status: DC

## 2020-04-23 NOTE — Telephone Encounter (Signed)
Patients mom called in and made aware of the Rx being sent to the pharmacy. Patients mom says the pharmacy only gave the patient 7 tablets and was upset about it. I informed the patients mom the amount of tablets that was sent to pharmacy, also told her to contact pharmacy back. She said she would and would call us back if she couldn't get anywhere with them

## 2020-04-23 NOTE — Telephone Encounter (Signed)
Patient came in office a day late for appt is asking for a bridge of medication until next visit 05/06/20    venlafaxine XR (EFFEXOR-XR) 150 MG 24 hr capsule   ARIPiprazole (ABILIFY) 2 MG tablet    [facility] - [place], [place] - [address]   Please call and advise

## 2020-04-23 NOTE — Telephone Encounter (Signed)
Tried contacting patient to advise short term supply has been sent but no further refills until seen for appt on 5/25. If patient returns call, please notify.

## 2020-05-06 ENCOUNTER — Telehealth: Payer: Self-pay

## 2020-05-06 ENCOUNTER — Encounter: Payer: Self-pay | Admitting: Family Medicine

## 2020-05-06 ENCOUNTER — Ambulatory Visit: Payer: BC Managed Care – PPO | Admitting: Family Medicine

## 2020-05-06 ENCOUNTER — Other Ambulatory Visit: Payer: Self-pay

## 2020-05-06 VITALS — BP 110/74 | HR 94 | Temp 97.8°F | Resp 16 | Ht 67.0 in | Wt 198.2 lb

## 2020-05-06 DIAGNOSIS — Z79899 Other long term (current) drug therapy: Secondary | ICD-10-CM

## 2020-05-06 DIAGNOSIS — E669 Obesity, unspecified: Secondary | ICD-10-CM | POA: Diagnosis not present

## 2020-05-06 MED ORDER — VENLAFAXINE HCL ER 150 MG PO CP24: ORAL_CAPSULE | ORAL | 1 refills | Status: DC

## 2020-05-06 MED ORDER — ARIPIPRAZOLE 2 MG PO TABS: 2.0000 mg | ORAL_TABLET | Freq: Every day | ORAL | 1 refills | Status: DC

## 2020-05-06 NOTE — Progress Notes (Signed)
OFFICE VISIT  05/06/2020   CC:  Chief Complaint  Patient presents with  . Follow-up    MDD, GAD   HPI:    Patient is a 49 y.o. Caucasian male who presents for follow up recurrent MDD and GAD with panic attacks. + hx of polysubstance abuse. I last saw him about 6 mo ago. A/P as of last visit: "1) Recurrent MDD + GAD. Has been doing well on effexor xr 300 mg qd and abilify 2mg  qd. Needs to get back on meds after running out 2 wks ago. Recommended restart of effexor xr at ONE of the 150mg  tabs daily x 4-5d before resuming the TWO tabs per day he has been on long term. He is not interested in counseling and he feels like he doesn't need to be seen by a psychiatrist anymore. 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 Mood and anxiety very stable.  No side effects from meds. Diet is poor, lots of fast food. NO exercise in a long time. Has gained 30 lbs over the last 6 mo. No drugs or alc since I last saw him.  Hx of benzo rx for anxiety (by his former psychiatrist) in the past but I don't have him on any controlled substances anymore. PMP AWARE reviewed today: most recent rx for alpraz was filled 07/15/19, # 89, rx by Dr. Harrington Challenger. No red flags.  Past Medical History:  Diagnosis Date  . Depression    zoloft no help   . GAD (generalized anxiety disorder)   . Opioid abuse ([place]) 2018  . Panic attacks   . Serotonin syndrome 09/17/2014   on paxil + wellbutrin    History reviewed. No pertinent surgical history.  Outpatient Medications Prior to Visit  Medication Sig Dispense Refill  . ARIPiprazole (ABILIFY) 2 MG tablet Take 1 tablet (2 mg total) by mouth daily. 14 tablet 0  . venlafaxine XR (EFFEXOR-XR) 150 MG 24 hr capsule TAKE TWO CAPSULES BY MOUTH EVERY DAY WITH BREAKFAST 28 capsule 0   No facility-administered medications prior to visit.    No Known  Allergies  ROS As per HPI  PE: Vitals with BMI 05/06/2020 11/06/2019 03/08/2017  Height 5\' 7"  - 5\' 7"   Weight 198 lbs 3 oz 165 lbs 167 lbs 12 oz  BMI 01.60 - 10.9  Systolic 323 - 557  Diastolic 74 - 75  Pulse 94 - 63  Some encounter information is confidential and restricted. Go to Review Flowsheets activity to see all data.    Gen: Alert, well appearing.  Patient is oriented to person, place, time, and situation. AFFECT: pleasant, lucid thought and speech. No further exam today.  LABS:  Lab Results  Component Value Date   TSH 1.354 10/09/2014   Lab Results  Component Value Date   WBC 4.8 12/23/2015   HGB 15.2 12/23/2015   HCT 46.7 12/23/2015   MCV 95.7 12/23/2015   PLT 261 12/23/2015   Lab Results  Component Value Date   CREATININE 1.05 12/23/2015   BUN 22 (H) 12/23/2015   NA 141 12/23/2015   K 4.9 12/23/2015   CL 105 12/23/2015   CO2 31 12/23/2015   Lab Results  Component Value Date   ALT 18 10/09/2014   AST 30 10/09/2014   ALKPHOS 72 10/09/2014   BILITOT 0.4 10/09/2014    IMPRESSION  AND PLAN:  1) Recurrent MDD and GAD with panic: stable on abilify 2mg  qd and effexor xr 300 mg qd long term.  Historically he does not do well at all when off his meds. Continue meds, RF's today. He has signif wt gain over the last 35mo.  This is from getting off exercise regimen completely and eating high calorie diet.  We discussed slight inc in risk of metabolic syndrome from abilify. He has not had any lab work since 2017. Not fasting today so we'll arrange fasting lipids, CMET, and HbA1c at [facility] lab at his earliest convenience. He is trying to get back into exercise regimen and eating healthy.  Goal wt around 165.  Hx of polysubstance abuse: doing well, no relapses.  An After Visit Summary was printed and given to the patient.  FOLLOW UP: Return in about 6 months (around 11/06/2020) for annual CPE (fasting).  Signed:  11/08/2020, MD           05/06/2020

## 2020-05-06 NOTE — Telephone Encounter (Signed)
Patient was seen in office today, requested to have labs done at [facility] lab. Lab requisitions faxed.

## 2020-12-04 ENCOUNTER — Other Ambulatory Visit: Payer: Self-pay | Admitting: Family Medicine

## 2020-12-08 MED ORDER — VENLAFAXINE HCL ER 150 MG PO CP24: ORAL_CAPSULE | ORAL | 0 refills | Status: DC

## 2020-12-08 MED ORDER — ARIPIPRAZOLE 2 MG PO TABS: 2.0000 mg | ORAL_TABLET | Freq: Every day | ORAL | 0 refills | Status: DC

## 2020-12-08 NOTE — Addendum Note (Signed)
Addended by: Paschal Dopp on: 12/08/2020 10:51 AM   Modules accepted: Orders

## 2021-03-20 ENCOUNTER — Ambulatory Visit: Payer: BC Managed Care – PPO | Admitting: Family Medicine

## 2021-03-23 ENCOUNTER — Ambulatory Visit: Payer: BC Managed Care – PPO | Admitting: Family Medicine

## 2021-03-23 DIAGNOSIS — Z0289 Encounter for other administrative examinations: Secondary | ICD-10-CM

## 2021-03-23 NOTE — Progress Notes (Deleted)
OFFICE VISIT  03/23/2021  CC: No chief complaint on file.   HPI:    Patient is a 50 y.o. Caucasian male who presents for CPE and f/u recurrent MDD and GAD with panic attacks. + hx of polysubstance abuse. I last saw him 05/06/20. A/P as of that visit: "1) Recurrent MDD and GAD with panic: stable on abilify 2mg  qd and effexor xr 300 mg qd long term.  Historically he does not do well at all when off his meds. Continue meds, RF's today. He has signif wt gain over the last 65mo.  This is from getting off exercise regimen completely and eating high calorie diet.  We discussed slight inc in risk of metabolic syndrome from abilify. He has not had any lab work since 2017. Not fasting today so we'll arrange fasting lipids, CMET, and HbA1c at [facility] lab at his earliest convenience. He is trying to get back into exercise regimen and eating healthy.  Goal wt around 165.  Hx of polysubstance abuse: doing well, no relapses."  INTERIM HX: *** He did not get labs after last visit as we had planned.    Past Medical History:  Diagnosis Date  . Depression    zoloft no help   . GAD (generalized anxiety disorder)   . Opioid abuse ([facility]) 2018  . Panic attacks   . Serotonin syndrome 09/17/2014   on paxil + wellbutrin    No past surgical history on file.  Outpatient Medications Prior to Visit  Medication Sig Dispense Refill  . ARIPiprazole (ABILIFY) 2 MG tablet Take 1 tablet (2 mg total) by mouth daily. 30 tablet 0  . venlafaxine XR (EFFEXOR-XR) 150 MG 24 hr capsule TAKE TWO CAPSULES BY MOUTH DAILY with breakfast 60 capsule 0   No facility-administered medications prior to visit.    No Known Allergies  ROS As per HPI  PE: Vitals with BMI 05/06/2020 11/06/2019 03/08/2017  Height 5\' 7"  - 5\' 7"   Weight 198 lbs 3 oz 165 lbs 167 lbs 12 oz  BMI 31.04 - 26.3  Systolic 110 - 121  Diastolic 74 - 75  Pulse 94 - 63  Some encounter information is confidential and restricted. Go to Review Flowsheets  activity to see all data.     ***  LABS:  Lab Results  Component Value Date   TSH 1.354 10/09/2014   Lab Results  Component Value Date   WBC 4.8 12/23/2015   HGB 15.2 12/23/2015   HCT 46.7 12/23/2015   MCV 95.7 12/23/2015   PLT 261 12/23/2015   Lab Results  Component Value Date   CREATININE 1.05 12/23/2015   BUN 22 (H) 12/23/2015   NA 141 12/23/2015   K 4.9 12/23/2015   CL 105 12/23/2015   CO2 31 12/23/2015   Lab Results  Component Value Date   ALT 18 10/09/2014   AST 30 10/09/2014   ALKPHOS 72 10/09/2014   BILITOT 0.4 10/09/2014   IMPRESSION AND PLAN:  No problem-specific Assessment & Plan notes found for this encounter.  Health maintenance exam: Reviewed age and gender appropriate health maintenance issues (prudent diet, regular exercise, health risks of tobacco and excessive alcohol, use of seatbelts, fire alarms in home, use of sunscreen).  Also reviewed age and gender appropriate health screening as well as vaccine recommendations. Vaccines: Tdap booster due->***. Labs: ? HP*** Prostate ca screening: average risk patient= as per latest guidelines, start screening at 19 yrs of age. Colon ca screening: average risk patient= as per latest  guidelines, start screening at any time now->***.  An After Visit Summary was printed and given to the patient.  FOLLOW UP: No follow-ups on file.  Signed:  Santiago Bumpers, MD           03/23/2021

## 2021-03-26 ENCOUNTER — Ambulatory Visit: Payer: BC Managed Care – PPO | Admitting: Family Medicine

## 2021-03-26 ENCOUNTER — Other Ambulatory Visit: Payer: Self-pay

## 2021-03-26 ENCOUNTER — Telehealth: Payer: Self-pay

## 2021-03-26 MED ORDER — ARIPIPRAZOLE 2 MG PO TABS: 2.0000 mg | ORAL_TABLET | Freq: Every day | ORAL | 0 refills | Status: DC

## 2021-03-26 MED ORDER — VENLAFAXINE HCL ER 150 MG PO CP24: ORAL_CAPSULE | ORAL | 0 refills | Status: DC

## 2021-03-26 NOTE — Telephone Encounter (Signed)
Sent MyChart message to r/s appt

## 2021-04-06 ENCOUNTER — Ambulatory Visit: Payer: BC Managed Care – PPO | Admitting: Family Medicine

## 2021-04-06 NOTE — Progress Notes (Deleted)
OFFICE VISIT  04/06/2021  CC: No chief complaint on file.   HPI:    Patient is a 50 y.o. Caucasian male who presents for follow up recurrent MDD and GAD with panic attacks. I last saw him 05/06/20. A/P as of that visit:  " Recurrent MDD and GAD with panic: stable on abilify 2mg  qd and effexor xr 300 mg qd long term.  Historically he does not do well at all when off his meds. Continue meds, RF's today. He has signif wt gain over the last 63mo.  This is from getting off exercise regimen completely and eating high calorie diet.  We discussed slight inc in risk of metabolic syndrome from abilify. He has not had any lab work since 2017. Not fasting today so we'll arrange fasting lipids, CMET, and HbA1c at [facility] lab at his earliest convenience. He is trying to get back into exercise regimen and eating healthy.  Goal wt around 165.  Hx of polysubstance abuse: doing well, no relapses.  Past Medical History:  Diagnosis Date  . Depression    zoloft no help   . GAD (generalized anxiety disorder)   . Opioid abuse ([facility]) 2018  . Panic attacks   . Serotonin syndrome 09/17/2014   on paxil + wellbutrin    No past surgical history on file.  Outpatient Medications Prior to Visit  Medication Sig Dispense Refill  . ARIPiprazole (ABILIFY) 2 MG tablet Take 1 tablet (2 mg total) by mouth daily. 30 tablet 0  . venlafaxine XR (EFFEXOR-XR) 150 MG 24 hr capsule TAKE TWO CAPSULES BY MOUTH DAILY with breakfast 60 capsule 0   No facility-administered medications prior to visit.    No Known Allergies  ROS As per HPI  PE: Vitals with BMI 05/06/2020 11/06/2019 03/08/2017  Height 5\' 7"  - 5\' 7"   Weight 198 lbs 3 oz 165 lbs 167 lbs 12 oz  BMI 31.04 - 26.3  Systolic 110 - 121  Diastolic 74 - 75  Pulse 94 - 63  Some encounter information is confidential and restricted. Go to Review Flowsheets activity to see all data.     ***  LABS:  Lab Results  Component Value Date   TSH 1.354 10/09/2014   Lab  Results  Component Value Date   WBC 4.8 12/23/2015   HGB 15.2 12/23/2015   HCT 46.7 12/23/2015   MCV 95.7 12/23/2015   PLT 261 12/23/2015   Lab Results  Component Value Date   CREATININE 1.05 12/23/2015   BUN 22 (H) 12/23/2015   NA 141 12/23/2015   K 4.9 12/23/2015   CL 105 12/23/2015   CO2 31 12/23/2015   Lab Results  Component Value Date   ALT 18 10/09/2014   AST 30 10/09/2014   ALKPHOS 72 10/09/2014   BILITOT 0.4 10/09/2014    IMPRESSION AND PLAN:  No problem-specific Assessment & Plan notes found for this encounter.  Needs fasting labs ?  An After Visit Summary was printed and given to the patient.  FOLLOW UP: No follow-ups on file.  Signed:  10/11/2014, MD           04/06/2021

## 2021-04-07 ENCOUNTER — Telehealth: Payer: Self-pay

## 2021-04-07 NOTE — Telephone Encounter (Signed)
Please assist patient with scheduling, thanks. 

## 2021-04-07 NOTE — Telephone Encounter (Signed)
Patient scheduled for 5/5 at 4PM with Dr. Milinda Cave

## 2021-04-07 NOTE — Telephone Encounter (Signed)
Yes, 4PM on 5/5 is fine

## 2021-04-07 NOTE — Telephone Encounter (Signed)
Please advise if ok ?

## 2021-04-07 NOTE — Telephone Encounter (Signed)
Patient had to cancel appt yesterday because of his employer. Because of his work schedule, patient is requesting a 4pm appt time.  Can patient be scheduled at 4pm on 04/16/21 with Dr. Milinda Cave?  No follow up call needed for patient at this time.  Thank you

## 2021-04-16 ENCOUNTER — Encounter: Payer: Self-pay | Admitting: Family Medicine

## 2021-04-16 ENCOUNTER — Ambulatory Visit: Payer: BC Managed Care – PPO | Admitting: Family Medicine

## 2021-04-16 ENCOUNTER — Other Ambulatory Visit: Payer: Self-pay

## 2021-04-16 VITALS — BP 106/70 | HR 114 | Temp 98.2°F | Resp 16 | Ht 67.0 in | Wt 186.6 lb

## 2021-04-16 DIAGNOSIS — F319 Bipolar disorder, unspecified: Secondary | ICD-10-CM

## 2021-04-16 MED ORDER — VENLAFAXINE HCL ER 150 MG PO CP24: ORAL_CAPSULE | ORAL | 3 refills | Status: DC

## 2021-04-16 MED ORDER — OLANZAPINE 5 MG PO TABS: 5.0000 mg | ORAL_TABLET | Freq: Every day | ORAL | 0 refills | Status: DC

## 2021-04-16 NOTE — Progress Notes (Signed)
OFFICE VISIT  04/16/2021  CC:  Chief Complaint  Patient presents with  . Follow-up    Recurrent MDD    HPI:    Patient is a 50 y.o. Caucasian male who presents for follow up recurrent MDD and GAD with panic attacks. I last saw him 05/06/20. A/P as of that visit: "Recurrent MDD and GAD with panic: stable on abilify 2mg  qd and effexor xr 300 mg qd long term.  Historically he does not do well at all when off his meds. Continue meds, RF's today. He has signif wt gain over the last 57mo.  This is from getting off exercise regimen completely and eating high calorie diet.  We discussed slight inc in risk of metabolic syndrome from abilify. He has not had any lab work since 2017. Not fasting today so we'll arrange fasting lipids, CMET, and HbA1c at [facility] lab at his earliest convenience. He is trying to get back into exercise regimen and eating healthy.  Goal wt around 165.  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 No hobbies.  Feels no motivation.  No sadness or cryng spells, no SI or HI.  No periods of hypomania or mania.  No signif anxiety.  Seems to be self isolating but hard to tell if this is intentional. Sleeping well, "probably too much".  Feels "level" and wonders if there is something that can give him motivation.   Libido is intact.  No side effects from abilify or effexor.    Past Medical History:  Diagnosis Date  . Depression    zoloft no help   . GAD (generalized anxiety disorder)   . Opioid abuse ([facility]) 2018  . Panic attacks   . Serotonin syndrome 09/17/2014   on paxil + wellbutrin    History reviewed. No pertinent surgical history.  Outpatient Medications Prior to Visit  Medication Sig Dispense Refill  . ARIPiprazole (ABILIFY) 2 MG tablet Take 1 tablet (2 mg total) by mouth daily. 30 tablet 0  . venlafaxine XR (EFFEXOR-XR) 150 MG 24 hr capsule TAKE TWO CAPSULES BY MOUTH  DAILY with breakfast 60 capsule 0   No facility-administered medications prior to visit.    No Known Allergies  ROS As per HPI  PE: Vitals with BMI 04/16/2021 05/06/2020 11/06/2019  Height 5\' 7"  5\' 7"  -  Weight 186 lbs 10 oz 198 lbs 3 oz 165 lbs  BMI 29.22 31.04 -  Systolic 106 110 -  Diastolic 70 74 -  Pulse 114 94 -  Some encounter information is confidential and restricted. Go to Review Flowsheets activity to see all data.     Wt Readings from Last 2 Encounters:  04/16/21 186 lb 9.6 oz (84.6 kg)  05/06/20 198 lb 3.2 oz (89.9 kg)    Gen: alert, oriented x 4, affect pleasant.  Lucid thinking and conversation noted. HEENT: PERRLA, EOMI.   Neck: no LAD, mass, or thyromegaly. CV: RRR, no m/r/g LUNGS: CTA bilat, nonlabored. NEURO: no tremor or tics noted on observation.  Coordination intact. CN 2-12 grossly intact bilaterally, strength 5/5 in all extremeties.  No ataxia.   LABS:  Lab Results  Component Value Date   TSH 1.354 10/09/2014   Lab Results  Component Value Date   WBC 4.8 12/23/2015   HGB 15.2 12/23/2015   HCT 46.7 12/23/2015   MCV 95.7 12/23/2015   PLT 261 12/23/2015   Lab Results  Component Value Date   CREATININE 1.05 12/23/2015   BUN 22 (H) 12/23/2015   NA 141 12/23/2015   K 4.9 12/23/2015   CL 105 12/23/2015   CO2 31 12/23/2015   Lab Results  Component Value Date   ALT 18 10/09/2014   AST 30 10/09/2014   ALKPHOS 72 10/09/2014   BILITOT 0.4 10/09/2014   IMPRESSION AND PLAN:  1) Bipolar depression, not in complete remission. Poor motivation and isolation are the main ways it is manifesting now. No sx's of hypogonadism. Will d/c abilify and do trial of zyprexa 5mg  qd. Continue effexor xr 150mg , 2 tabs qd--RF 'd today.  An After Visit Summary was printed and given to the patient.  FOLLOW UP: Return for 2-3 week f/u bip dep.  Signed:  , MD           04/16/2021

## 2021-07-06 DIAGNOSIS — R079 Chest pain, unspecified: Secondary | ICD-10-CM | POA: Diagnosis not present

## 2021-07-07 ENCOUNTER — Other Ambulatory Visit: Payer: Self-pay

## 2021-07-07 ENCOUNTER — Encounter (HOSPITAL_COMMUNITY): Payer: Self-pay | Admitting: Emergency Medicine

## 2021-07-07 ENCOUNTER — Emergency Department (HOSPITAL_COMMUNITY): Payer: BC Managed Care – PPO

## 2021-07-07 ENCOUNTER — Emergency Department (HOSPITAL_COMMUNITY)
Admission: EM | Admit: 2021-07-07 | Discharge: 2021-07-07 | Disposition: A | Discharge status: Home / Self Care | Payer: BC Managed Care – PPO | Attending: Emergency Medicine | Admitting: Emergency Medicine

## 2021-07-07 DIAGNOSIS — B029 Zoster without complications: Secondary | ICD-10-CM | POA: Diagnosis not present

## 2021-07-07 DIAGNOSIS — R0789 Other chest pain: Secondary | ICD-10-CM | POA: Diagnosis not present

## 2021-07-07 DIAGNOSIS — R079 Chest pain, unspecified: Secondary | ICD-10-CM | POA: Diagnosis not present

## 2021-07-07 LAB — CBC WITH DIFFERENTIAL/PLATELET
Abs Immature Granulocytes: 0.03 10*3/uL (ref 0.00–0.07)
Basophils Absolute: 0.1 10*3/uL (ref 0.0–0.1)
Basophils Relative: 1 %
Eosinophils Absolute: 0.2 10*3/uL (ref 0.0–0.5)
Eosinophils Relative: 2 %
HCT: 51 % (ref 39.0–52.0)
Hemoglobin: 17.2 g/dL — ABNORMAL HIGH (ref 13.0–17.0)
Immature Granulocytes: 0 %
Lymphocytes Relative: 18 %
Lymphs Abs: 1.5 10*3/uL (ref 0.7–4.0)
MCH: 32.1 pg (ref 26.0–34.0)
MCHC: 33.7 g/dL (ref 30.0–36.0)
MCV: 95.3 fL (ref 80.0–100.0)
Monocytes Absolute: 1.1 10*3/uL — ABNORMAL HIGH (ref 0.1–1.0)
Monocytes Relative: 13 %
Neutro Abs: 5.7 10*3/uL (ref 1.7–7.7)
Neutrophils Relative %: 66 %
Platelets: 288 10*3/uL (ref 150–400)
RBC: 5.35 MIL/uL (ref 4.22–5.81)
RDW: 12 % (ref 11.5–15.5)
WBC: 8.6 10*3/uL (ref 4.0–10.5)
nRBC: 0 % (ref 0.0–0.2)

## 2021-07-07 LAB — BASIC METABOLIC PANEL
Anion gap: 8 (ref 5–15)
BUN: 11 mg/dL (ref 6–20)
CO2: 27 mmol/L (ref 22–32)
Calcium: 9.2 mg/dL (ref 8.9–10.3)
Chloride: 101 mmol/L (ref 98–111)
Creatinine, Ser: 0.95 mg/dL (ref 0.61–1.24)
GFR, Estimated: >60 mL/min (ref >60)
Glucose, Bld: 95 mg/dL (ref 70–99)
Potassium: 4.2 mmol/L (ref 3.5–5.1)
Sodium: 136 mmol/L (ref 135–145)

## 2021-07-07 LAB — TROPONIN I (HIGH SENSITIVITY)
Troponin I (High Sensitivity): <2 ng/L (ref <18)
Troponin I (High Sensitivity): <2 ng/L (ref <18)

## 2021-07-07 MED ORDER — VALACYCLOVIR HCL 1 G PO TABS: 1000.0000 mg | ORAL_TABLET | Freq: Three times a day (TID) | ORAL | 0 refills | Status: DC

## 2021-07-07 MED ORDER — NAPROXEN 500 MG PO TABS: 500.0000 mg | ORAL_TABLET | Freq: Two times a day (BID) | ORAL | 0 refills | Status: DC

## 2021-07-07 NOTE — ED Notes (Signed)
Called lab to inquire about troponin. Was informed that there is only 1 phlebotomist and it will be drawn when she is available.

## 2021-07-07 NOTE — ED Triage Notes (Signed)
Pt c/o left sided chest pain since Thursday. Pt states pain has been off and on since. Pt seen at [facility] yesterday and advised to be seen in ED then.

## 2021-07-07 NOTE — ED Provider Notes (Signed)
[facility] EMERGENCY DEPARTMENT Provider Note   CSN: [id number] Arrival date & time: 07/07/21  1248     History Chief Complaint  Patient presents with   Chest Pain    Allen Conley is a 50 y.o. male.  Patient with a complaint of left anterior chest pain that is been constant since Thursday.  Started to break out with a rash that was red with like blisters on it today.  Past medical history significant for depression and bipolar disorder.  Past history of opiate abuse.  Patient denies any fevers.  Denies any shortness of breath.  Oxygen saturation on room air was 94%.  Patient denies any upper respiratory infection symptoms      Past Medical History:  Diagnosis Date   Depression    zoloft no help    GAD (generalized anxiety disorder)    Opioid abuse ([facility]) 2018   Panic attacks    Serotonin syndrome 09/17/2014   on paxil + wellbutrin    Patient Active Problem List   Diagnosis Date Noted   Moderate episode of recurrent major depressive disorder (HCC)    Recurrent major depression (HCC) 12/24/2015   Viral gastroenteritis 12/25/2014   Bipolar II disorder (HCC) 10/18/2014   Major depression 07/12/2014   GAD (generalized anxiety disorder) 07/12/2014   Panic attacks 07/12/2014    History reviewed. No pertinent surgical history.     Family History  Problem Relation Age of Onset   Cancer Maternal Grandmother    Cancer Maternal Grandfather    Depression Mother    Drug abuse Brother     Social History   Tobacco Use   Smoking status: Never   Smokeless tobacco: Never  Substance Use Topics   Alcohol use: No    Comment: socially   Drug use: No    Types: "Crack" cocaine    Home Medications Prior to Admission medications   Medication Sig Start Date End Date Taking? Authorizing Provider  naproxen (NAPROSYN) 500 MG tablet Take 1 tablet (500 mg total) by mouth 2 (two) times daily. 07/07/21  Yes Vanetta Mulders, MD  valACYclovir (VALTREX) 1000 MG tablet Take 1 tablet  (1,000 mg total) by mouth 3 (three) times daily. 07/07/21  Yes Vanetta Mulders, MD  venlafaxine XR (EFFEXOR-XR) 150 MG 24 hr capsule TAKE TWO CAPSULES BY MOUTH DAILY with breakfast 04/16/21  Yes McGowen, Maryjean Morn, MD  OLANZapine (ZYPREXA) 5 MG tablet Take 1 tablet (5 mg total) by mouth at bedtime. Patient not taking: No sig reported 04/16/21   McGowen, Maryjean Morn, MD    Allergies    Patient has no known allergies.  Review of Systems   Review of Systems  Constitutional:  Negative for chills and fever.  HENT:  Negative for ear pain and sore throat.   Eyes:  Negative for pain and visual disturbance.  Respiratory:  Negative for cough and shortness of breath.   Cardiovascular:  Positive for chest pain. Negative for palpitations.  Gastrointestinal:  Negative for abdominal pain and vomiting.  Genitourinary:  Negative for dysuria and hematuria.  Musculoskeletal:  Negative for arthralgias and back pain.  Skin:  Positive for rash. Negative for color change.  Neurological:  Negative for seizures and syncope.  All other systems reviewed and are negative.  Physical Exam Updated Vital Signs BP 113/77   Pulse (!) 108   Temp 97.8 F (36.6 C) (Oral)   Resp 18   Ht 1.702 m (5\' 7" )   Wt 86.2 kg   SpO2  100%   BMI 29.76 kg/m   Physical Exam Vitals and nursing note reviewed.  Constitutional:      General: He is not in acute distress.    Appearance: Normal appearance. He is well-developed.  HENT:     Head: Normocephalic and atraumatic.  Eyes:     Extraocular Movements: Extraocular movements intact.     Conjunctiva/sclera: Conjunctivae normal.     Pupils: Pupils are equal, round, and reactive to light.  Cardiovascular:     Rate and Rhythm: Normal rate and regular rhythm.     Heart sounds: No murmur heard. Pulmonary:     Effort: Pulmonary effort is normal. No respiratory distress.     Breath sounds: Normal breath sounds.     Comments: Patient with a left-sided rash vesicular in nature  erythematous starting midline part of the back thoracic back radiating around to the anterior part of the chest with redness and vesicles.  Does not cross midline. Abdominal:     Palpations: Abdomen is soft.     Tenderness: There is no abdominal tenderness.  Musculoskeletal:        General: No swelling.     Cervical back: Neck supple.  Skin:    General: Skin is warm and dry.     Findings: Rash present.  Neurological:     General: No focal deficit present.     Mental Status: He is alert and oriented to person, place, and time.    ED Results / Procedures / Treatments   Labs (all labs ordered are listed, but only abnormal results are displayed) Labs Reviewed  CBC WITH DIFFERENTIAL/PLATELET - Abnormal; Notable for the following components:      Result Value   Hemoglobin 17.2 (*)    Monocytes Absolute 1.1 (*)    All other components within normal limits  BASIC METABOLIC PANEL  TROPONIN I (HIGH SENSITIVITY)  TROPONIN I (HIGH SENSITIVITY)    EKG EKG Interpretation  Date/Time:  Tuesday July 07 2021 12:59:56 EDT Ventricular Rate:  109 PR Interval:  130 QRS Duration: 64 QT Interval:  318 QTC Calculation: 428 R Axis:   104 Text Interpretation: Sinus tachycardia Rightward axis Cannot rule out Anterior infarct , age undetermined Abnormal ECG No previous ECGs available Confirmed by Vanetta Mulders [phone]) on 07/07/2021 7:16:02 PM  Radiology DG Chest 2 View  Result Date: 07/07/2021 CLINICAL DATA:  Chest pain.  Left-sided chest pain. EXAM: CHEST - 2 VIEW COMPARISON:  None. FINDINGS: The heart size and mediastinal contours are within normal limits. Both lungs are clear. The visualized skeletal structures are unremarkable. Negative for a pneumothorax. IMPRESSION: No active cardiopulmonary disease. Electronically Signed   By: Richarda Overlie M.D.   On: 07/07/2021 14:09    Procedures Procedures   Medications Ordered in ED Medications - No data to display  ED Course  I have reviewed the  triage vital signs and the nursing notes.  Pertinent labs & imaging results that were available during my care of the patient were reviewed by me and considered in my medical decision making (see chart for details).    MDM Rules/Calculators/A&P                           Work-up for the chest pain without any acute findings.  Troponins x2 were negative.  EKG without acute findings.  CBC and basic metabolic panel normal.  Patient's symptoms are consistent with herpes zoster shingle infection.  On the left thoracic  dermatome.  We will treat with antiviral and nonsteroidal medication for the pain.   Final Clinical Impression(s) / ED Diagnoses Final diagnoses:  Nonspecific chest pain  Herpes zoster without complication    Rx / DC Orders ED Discharge Orders          Ordered    valACYclovir (VALTREX) 1000 MG tablet  3 times daily        07/07/21 1937    naproxen (NAPROSYN) 500 MG tablet  2 times daily        07/07/21 1938             Vanetta Mulders, MD 07/07/21 1943

## 2021-07-07 NOTE — Discharge Instructions (Addendum)
Take the Valtrex antiviral medication as directed for the next 7 days.  Take the Naprosyn as needed for pain.  Would expect some improvement over the next week or 2.  Make an appointment to follow-up with your primary care doctor to be reevaluated.  Return for any new or worse symptoms.  Work-up for the chest pain without any acute findings.  Cardiac enzymes were normal.

## 2021-07-07 NOTE — ED Provider Notes (Signed)
Emergency Medicine Provider Triage Evaluation Note  Allen Conley , a 50 y.o. male  was evaluated in triage.  Pt complains of left-sided chest pain.  Chest pain has been constant since Thursday.  Pain does not radiate.  No associated shortness of breath, nausea, vomiting, or diaphoresis.  Patient reports that he started developing a rash to the left side of his chest today.  Review of Systems  Positive: Chest pain, rash Negative: Shortness of breath, nausea, vomiting, diaphoresis, leg swelling, palpitations  Physical Exam  BP (!) 126/94 (BP Location: Right Arm)   Pulse (!) 112   Temp 97.8 F (36.6 C) (Oral)   Resp 18   Ht 5\' 7"  (1.702 m)   Wt 86.2 kg   SpO2 94%   BMI 29.76 kg/m  Gen:   Awake, no distress   Resp:  Normal effort  MSK:   Moves extremities without difficulty  Other:  Patient has groups of erythematous vesicles noted to left side of chest  Medical Decision Making  Medically screening exam initiated at 1:54 PM.  Appropriate orders placed.  was informed that the remainder of the evaluation will be completed by another provider, this initial triage assessment does not replace that evaluation, and the importance of remaining in the ED until their evaluation is complete.  The patient appears stable so that the remainder of the work up may be completed by another provider.      Leticia Penna, PA-C 07/07/21 1356    07/09/21, MD 07/07/21 1932

## 2021-07-15 ENCOUNTER — Telehealth: Payer: Self-pay | Admitting: Family Medicine

## 2021-07-15 MED ORDER — VALACYCLOVIR HCL 1 G PO TABS: 1000.0000 mg | ORAL_TABLET | Freq: Three times a day (TID) | ORAL | 0 refills | Status: DC

## 2021-07-15 NOTE — Telephone Encounter (Signed)
Pt was seen at ED on 7/26, given rx for 21 tablets 7 d/s of Valtrex.   Please advise if refill appropriate

## 2021-07-15 NOTE — Telephone Encounter (Signed)
Patient requesting refill of Valtrex for his Shingles. Med was prescribed recently in ED and patient has followup appt scheduled with Dr. Milinda Cave for Monday (next available).

## 2021-07-15 NOTE — Telephone Encounter (Signed)
Rx sent by PCP. Tried calling patient, unable to LVM.

## 2021-07-15 NOTE — Telephone Encounter (Signed)
Noted  

## 2021-07-15 NOTE — Telephone Encounter (Signed)
Patient returned call and I relayed Dr. Samul Dada message that the Valtrex has been prescribed.

## 2021-07-15 NOTE — Telephone Encounter (Signed)
OK, valtrex eRxd

## 2021-07-20 ENCOUNTER — Ambulatory Visit: Payer: BC Managed Care – PPO | Admitting: Family Medicine

## 2021-07-20 NOTE — Progress Notes (Deleted)
OFFICE VISIT  07/20/2021  CC: No chief complaint on file.  HPI:    Patient is a 50 y.o. Caucasian male who presents for follow up emergency dept visit for shingles. Presented to [facility] ED 07/07/21 (14 d/a) for painful L anterior chest rash.  He was found to have left sided erythematous vesicular rash on L thorax, midline to midline. Chest pain w/u was done: cxr, ekg, and troponins neg.  CBC and chemistries normal.  EKG reviewed and interpreted independently today->sinus tachy, rate 109, poor R wave progression, no ischemic changes, no ectopy.  Normal intervals and duration.  No hypertrophy.  Unchanged compared to 2015.  He was d/c'd home on valtrex 1 g tid x 7d along with naproxen.  Interim hx: Pt called me 07/15/21 and requested the valtrex to be extended so I did this and requested he come in today.  Past Medical History:  Diagnosis Date   Depression    zoloft no help    GAD (generalized anxiety disorder)    Opioid abuse (HCC) 2018   Panic attacks    Serotonin syndrome 09/17/2014   on paxil + wellbutrin    No past surgical history on file.  Outpatient Medications Prior to Visit  Medication Sig Dispense Refill   naproxen (NAPROSYN) 500 MG tablet Take 1 tablet (500 mg total) by mouth 2 (two) times daily. 14 tablet 0   OLANZapine (ZYPREXA) 5 MG tablet Take 1 tablet (5 mg total) by mouth at bedtime. (Patient not taking: No sig reported) 30 tablet 0   valACYclovir (VALTREX) 1000 MG tablet Take 1 tablet (1,000 mg total) by mouth 3 (three) times daily. 21 tablet 0   venlafaxine XR (EFFEXOR-XR) 150 MG 24 hr capsule TAKE TWO CAPSULES BY MOUTH DAILY with breakfast 180 capsule 3   No facility-administered medications prior to visit.    No Known Allergies  ROS As per HPI  PE: Vitals with BMI 07/07/2021 07/07/2021 07/07/2021  Height - - 5\' 7"   Weight - - 190 lbs  BMI - - 29.75  Systolic 113 126 -  Diastolic 77 94 -  Pulse 108 112 -  Some encounter information is confidential and  restricted. Go to Review Flowsheets activity to see all data.     ***  LABS:  Lab Results  Component Value Date   WBC 8.6 07/07/2021   HGB 17.2 (H) 07/07/2021   HCT 51.0 07/07/2021   MCV 95.3 07/07/2021   PLT 288 07/07/2021     Chemistry      Component Value Date/Time   NA 136 07/07/2021 1411   K 4.2 07/07/2021 1411   CL 101 07/07/2021 1411   CO2 27 07/07/2021 1411   BUN 11 07/07/2021 1411   CREATININE 0.95 07/07/2021 1411   CREATININE 1.12 10/09/2014 1638      Component Value Date/Time   CALCIUM 9.2 07/07/2021 1411   ALKPHOS 72 10/09/2014 1638   AST 30 10/09/2014 1638   ALT 18 10/09/2014 1638   BILITOT 0.4 10/09/2014 1638       IMPRESSION AND PLAN:  No problem-specific Assessment & Plan notes found for this encounter.   An After Visit Summary was printed and given to the patient.  FOLLOW UP: No follow-ups on file.  Signed:  10/11/2014, MD           07/20/2021

## 2021-07-23 ENCOUNTER — Encounter: Payer: Self-pay | Admitting: Family Medicine

## 2021-07-23 ENCOUNTER — Ambulatory Visit (INDEPENDENT_AMBULATORY_CARE_PROVIDER_SITE_OTHER): Payer: BC Managed Care – PPO | Admitting: Family Medicine

## 2021-07-23 ENCOUNTER — Other Ambulatory Visit: Payer: Self-pay

## 2021-07-23 VITALS — BP 105/69 | HR 103 | Temp 97.7°F | Resp 16 | Ht 67.0 in | Wt 187.6 lb

## 2021-07-23 DIAGNOSIS — R0789 Other chest pain: Secondary | ICD-10-CM

## 2021-07-23 DIAGNOSIS — B029 Zoster without complications: Secondary | ICD-10-CM

## 2021-07-23 DIAGNOSIS — F319 Bipolar disorder, unspecified: Secondary | ICD-10-CM

## 2021-07-23 MED ORDER — GABAPENTIN 100 MG PO CAPS: ORAL_CAPSULE | ORAL | 1 refills | Status: DC

## 2021-07-23 MED ORDER — ARIPIPRAZOLE 5 MG PO TABS: 5.0000 mg | ORAL_TABLET | Freq: Every day | ORAL | 0 refills | Status: DC

## 2021-07-23 NOTE — Progress Notes (Signed)
OFFICE VISIT  07/23/2021  CC:  Chief Complaint  Patient presents with   Rash    X 2 weeks, seen in ED  07/26 and given rx for Valtrex, 7 d/s. RF provided by PCP for another 7 d/s. Still having pain and discomfort on L side of chest   HPI:    Patient is a 50 y.o. Caucasian male who presents for painful rash. He went to the ED 07/07/21 (16 days ago) and was dx'd with shingles of left side of chest wall and was rx'd valtrex and naproxen.  INTERIM HX: Still in lots of pain in L pectoral area, rash is drying up and fading. Naproxen not helping.  Goody powder not helping. Nothing topical being tried, no heat or ice. Pain is so bad he had to leave work yesterday.  No fevers.  No n/v/abd pain.  No facial rash.  Pt also says he has not benefited any from the zyprexa I rx'd 3 mo ago to help his bipolar depression.  He was having poor motivation and isolation last time I saw him when he was on abilify 2mg  qd--this prompted to try changing to zyprexa. He has continued effexor xr 150mg , 2 tabs qd. Denies SI or HI.  Mood is down, motivation is down, has anhedonia.  Mild anxiety, no panic.  Past Medical History:  Diagnosis Date   Depression    zoloft no help    GAD (generalized anxiety disorder)    Opioid abuse (HCC) 2018   Panic attacks    Serotonin syndrome 09/17/2014   on paxil + wellbutrin    History reviewed. No pertinent surgical history.  Outpatient Medications Prior to Visit  Medication Sig Dispense Refill   valACYclovir (VALTREX) 1000 MG tablet Take 1 tablet (1,000 mg total) by mouth 3 (three) times daily. 21 tablet 0   venlafaxine XR (EFFEXOR-XR) 150 MG 24 hr capsule TAKE TWO CAPSULES BY MOUTH DAILY with breakfast 180 capsule 3   OLANZapine (ZYPREXA) 5 MG tablet Take 1 tablet (5 mg total) by mouth at bedtime. 30 tablet 0   naproxen (NAPROSYN) 500 MG tablet Take 1 tablet (500 mg total) by mouth 2 (two) times daily. (Patient not taking: Reported on 07/23/2021) 14 tablet 0   No  facility-administered medications prior to visit.    No Known Allergies  ROS As per HPI  PE: Vitals with BMI 07/23/2021 07/07/2021 07/07/2021  Height 5\' 7"  - -  Weight 187 lbs 10 oz - -  BMI 29.38 - -  Systolic 105 113 07/09/2021  Diastolic 69 77 94  Pulse 103 108 112  Some encounter information is confidential and restricted. Go to Review Flowsheets activity to see all data.   Gen: Alert, well appearing.  Patient is oriented to person, place, time, and situation. AFFECT: pleasant, lucid thought and speech. Skin: dry, scattered pinkish lesions with a few small scabs present-->left side of thorax, distributed in dermatomal pattern at about T3-T4 level from midline anteriorly to midline posteriorly.  No pustules or vesicles.  No erythema to suggest cellulitis. Minimal tenderness to palpation over the rash.  LABS:  none  IMPRESSION AND PLAN:  1) Herpes zoster, lingering pain but the rash is resolving appropriately. Discussed natural course of zoster, potential complications, potential for post-herpetic neuralgia. Will start gabapentin 100mg , 1 tab tid and titrate up to 3 tabs tid.  Therapeutic expectations and side effect profile of medication discussed today.  Hopefully he will respond some to this and will only need it  for a few weeks.  Ok to stop otc pain meds. I recommended her try otc capsaicin cream.  Patient's questions answered.  2) Bipolar depression: no help from zyprexa trial. He tolerated abilify 2mg  qd and wants to retry this med-->will restart him at the 5mg  qd dose and he'll continue effexor xr 300 mg qd.  An After Visit Summary was printed and given to the patient.  FOLLOW UP: Return in about 2 weeks (around 08/06/2021) for f/u shingles pain and depression.  Signed:  , MD           07/23/2021

## 2021-09-15 ENCOUNTER — Other Ambulatory Visit: Payer: Self-pay | Admitting: Family Medicine

## 2021-09-15 NOTE — Telephone Encounter (Signed)
Pt called stating he received 2 mg for medication. Pt was informed that last refill was sent 5 mg in Aug but was instructed to f/u in 2 weeks. Pt was sched for next avail 10/10 at 130.

## 2021-09-15 NOTE — Telephone Encounter (Signed)
Please review and advise, med pending

## 2021-09-16 MED ORDER — ARIPIPRAZOLE 5 MG PO TABS: 5.0000 mg | ORAL_TABLET | Freq: Every day | ORAL | 0 refills | Status: DC

## 2021-09-21 ENCOUNTER — Encounter: Payer: Self-pay | Admitting: Family Medicine

## 2021-09-21 ENCOUNTER — Other Ambulatory Visit: Payer: Self-pay

## 2021-09-21 ENCOUNTER — Ambulatory Visit: Payer: BC Managed Care – PPO | Admitting: Family Medicine

## 2021-09-21 VITALS — BP 100/68 | HR 105 | Temp 97.6°F | Ht 67.0 in | Wt 190.2 lb

## 2021-09-21 DIAGNOSIS — F329 Major depressive disorder, single episode, unspecified: Secondary | ICD-10-CM

## 2021-09-21 MED ORDER — ARIPIPRAZOLE 10 MG PO TABS: 10.0000 mg | ORAL_TABLET | Freq: Every day | ORAL | 0 refills | Status: DC

## 2021-09-21 NOTE — Progress Notes (Addendum)
OFFICE VISIT  09/21/2021  CC:  Chief Complaint  Patient presents with   Follow-up    Shingles, depression    HPI:    Patient is a 50 y.o. male who presents for 1 month f/u shingles pain and depression. A/P as of last visit: "1) Herpes zoster, lingering pain but the rash is resolving appropriately. Discussed natural course of zoster, potential complications, potential for post-herpetic neuralgia. Will start gabapentin 100mg , 1 tab tid and titrate up to 3 tabs tid.  Therapeutic expectations and side effect profile of medication discussed today.  Hopefully he will respond some to this and will only need it for a few weeks.  Ok to stop otc pain meds. I recommended her try otc capsaicin cream.   Patient's questions answered.   2) Bipolar depression: no help from zyprexa trial. He tolerated abilify 2mg  qd and wants to retry this med-->will restart him at the 5mg  qd dose and he'll continue effexor xr 300 mg qd."  INTERIM HX: His pain resolved a few days after I last saw him---took gabapentin only 1-2 days.  Tolerating abilify 5mg  qd so far-no side effects at all, has only been on it 2-3 wks total out of the last month or so---didn't take when he had shingles. Still taking effexor xr 300 mg qd. Anxiety not an issue lately at all, fortunately. Has chronic low intensity depressed mood, a couple days a week it gets signif worse. Has anhedonia, poor motivation.  +Apathy.  No HI or SI.  Not working out anymore. 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 No hx of hypomania or mania.  Working dx has been treatment-resistant depression.   Past Medical History:  Diagnosis Date   Depression    zoloft no help    GAD (generalized anxiety disorder)    Opioid abuse (HCC) 2018   Panic attacks    Serotonin syndrome 09/17/2014   on paxil + wellbutrin    History reviewed. No pertinent surgical history.  Outpatient Medications Prior to Visit  Medication Sig Dispense Refill    venlafaxine XR (EFFEXOR-XR) 150 MG 24 hr capsule TAKE TWO CAPSULES BY MOUTH DAILY with breakfast 180 capsule 3   ARIPiprazole (ABILIFY) 5 MG tablet Take 1 tablet (5 mg total) by mouth daily. 30 tablet 0   gabapentin (NEURONTIN) 100 MG capsule 1-3 tabs po tid for shingles pain 135 capsule 1   valACYclovir (VALTREX) 1000 MG tablet Take 1 tablet (1,000 mg total) by mouth 3 (three) times daily. 21 tablet 0   No facility-administered medications prior to visit.    No Known Allergies  ROS As per HPI  PE: Vitals with BMI 09/21/2021 07/23/2021 07/07/2021  Height 5\' 7"  5\' 7"  -  Weight 190 lbs 3 oz 187 lbs 10 oz -  BMI 29.78 29.38 -  Systolic 100 105 11/17/2014  Diastolic 68 69 77  Pulse 105 103 108  Some encounter information is confidential and restricted. Go to Review Flowsheets activity to see all data.    Gen: Alert, well appearing.  Patient is oriented to person, place, time, and situation. AFFECT: pleasant, lucid thought and speech. No exam today.  LABS:  NONE TODAY    Chemistry      Component Value Date/Time   NA 136 07/07/2021 1411   K 4.2 07/07/2021 1411   CL 101 07/07/2021 1411   CO2 27 07/07/2021 1411   BUN 11 07/07/2021 1411   CREATININE 0.95 07/07/2021 1411   CREATININE 1.12 10/09/2014 1638  Component Value Date/Time   CALCIUM 9.2 07/07/2021 1411   ALKPHOS 72 10/09/2014 1638   AST 30 10/09/2014 1638   ALT 18 10/09/2014 1638   BILITOT 0.4 10/09/2014 1638     IMPRESSION/PLAN:  #1: Treatment resistant depression/dysthymia. Has been on Effexor XR 300 mg daily.  Recent addition of Abilify 2 mg a day, then increase to 5 mg a day but has not taking it very consistently over the last month.  No side effects.  We will push dose to 10 mg daily.  2) Shingles: resolved and completely w/out pain now.  An After Visit Summary was printed and given to the patient.  FOLLOW UP: Return in about 4 weeks (around 10/19/2021) for f/u psych.  Signed:  Santiago Bumpers, MD            09/21/2021

## 2022-05-14 IMAGING — DX DG CHEST 2V
2 series · 2 of 2 positions shown · non-contrast
Comparison: None.

CLINICAL DATA: Chest pain.  Left-sided chest pain.

EXAM:
CHEST - 2 VIEW

[chest pa]
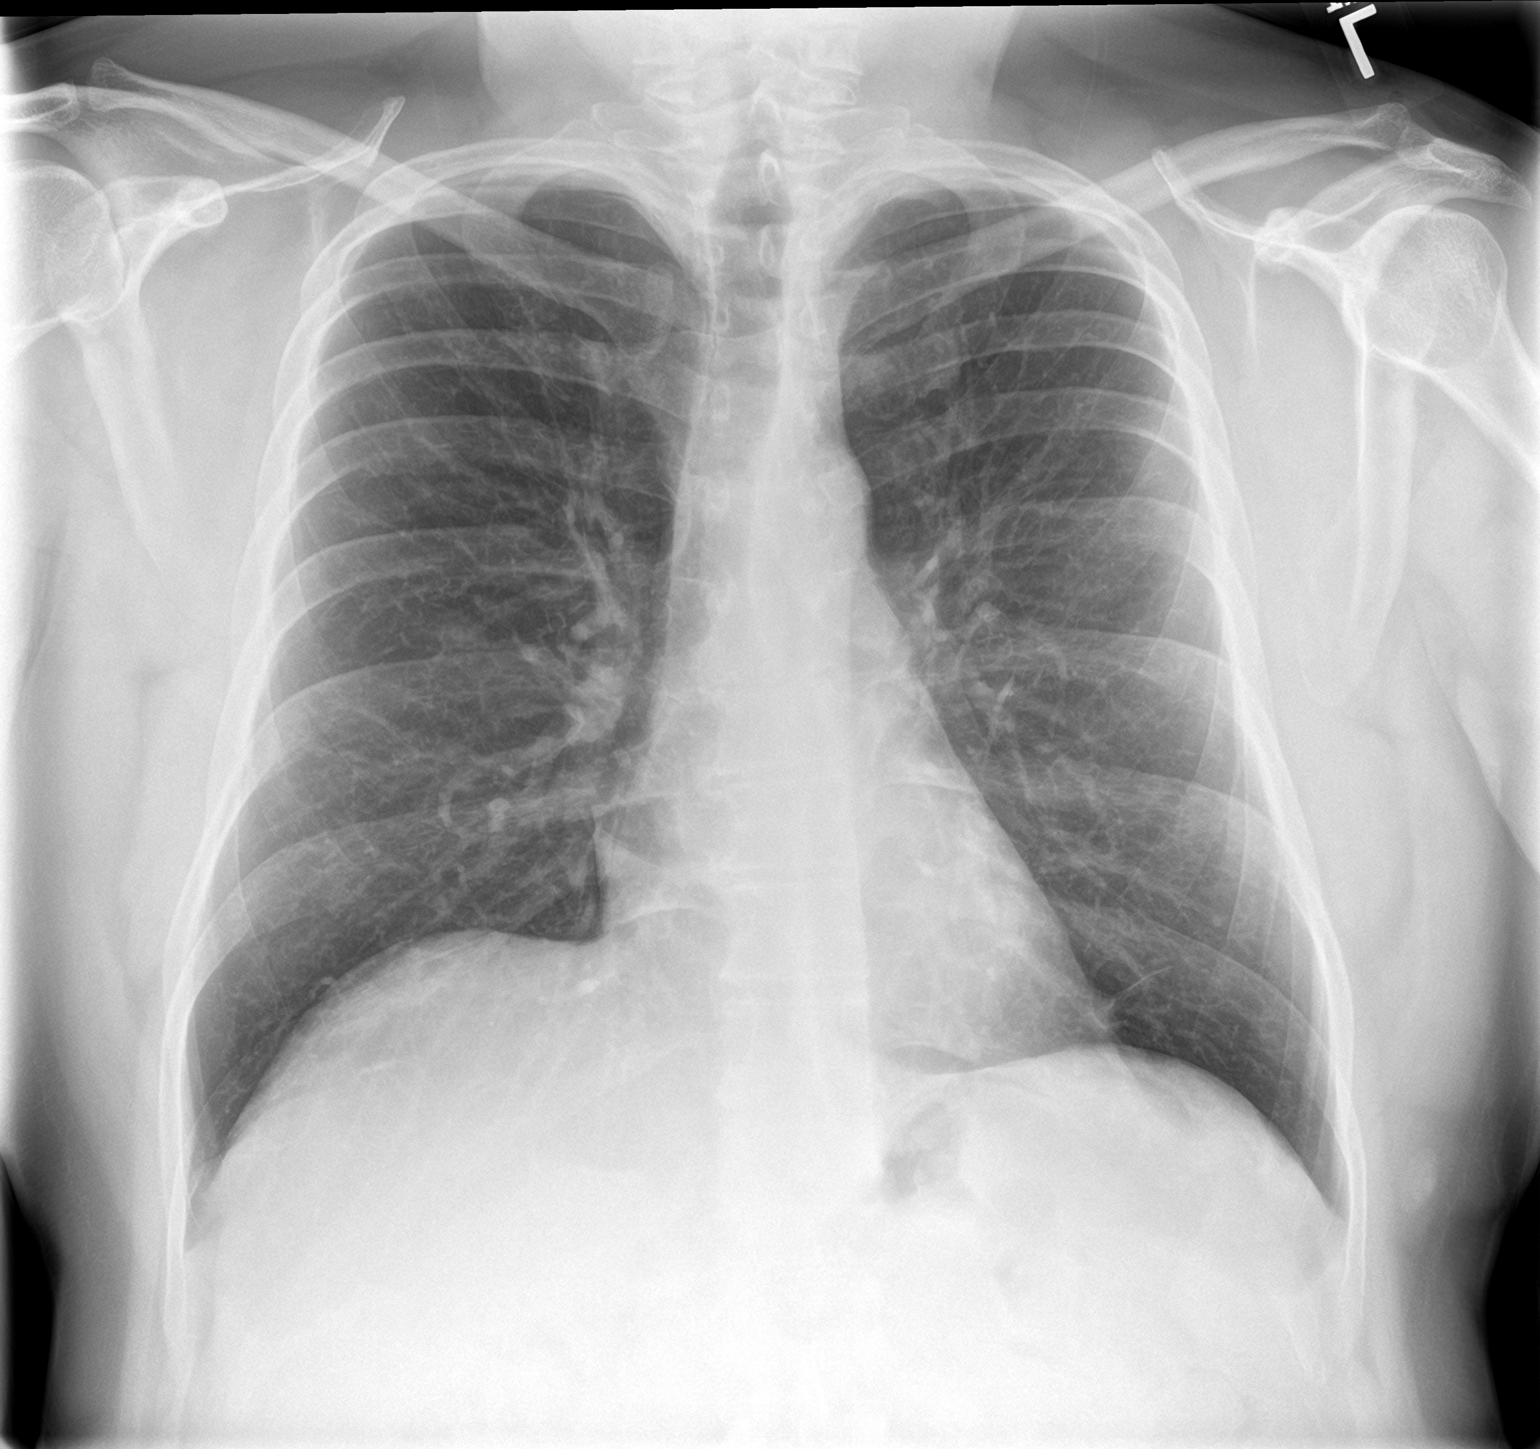

[chest lat]
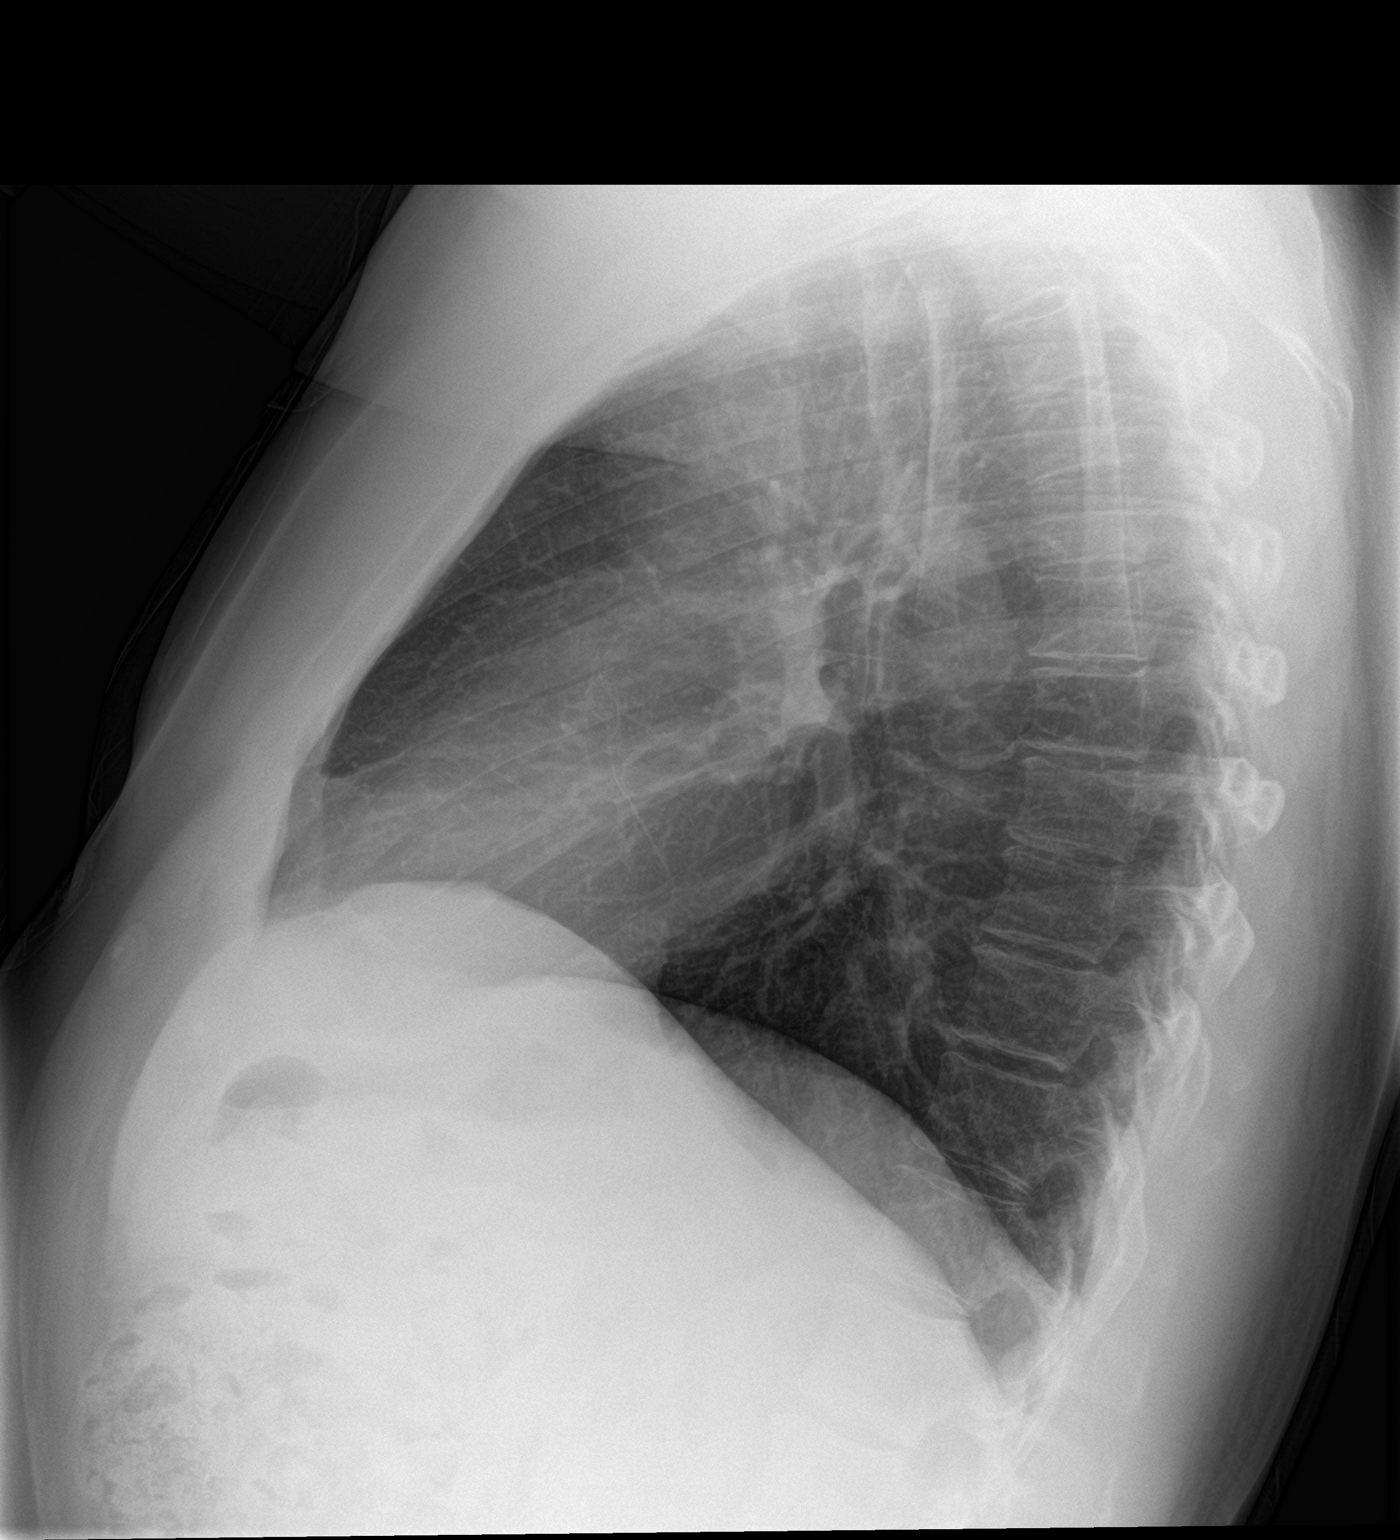

[2 of 2 positions shown; findings below may reference images not displayed]

FINDINGS: The heart size and mediastinal contours are within normal limits.
Both lungs are clear. The visualized skeletal structures are
unremarkable. Negative for a pneumothorax.
IMPRESSION: No active cardiopulmonary disease.

## 2022-10-04 ENCOUNTER — Telehealth (INDEPENDENT_AMBULATORY_CARE_PROVIDER_SITE_OTHER): Payer: BC Managed Care – PPO | Admitting: Family Medicine

## 2022-10-04 ENCOUNTER — Encounter: Payer: Self-pay | Admitting: Family Medicine

## 2022-10-04 DIAGNOSIS — F331 Major depressive disorder, recurrent, moderate: Secondary | ICD-10-CM

## 2022-10-04 MED ORDER — ARIPIPRAZOLE 10 MG PO TABS: 10.0000 mg | ORAL_TABLET | Freq: Every day | ORAL | 1 refills | Status: DC

## 2022-10-04 MED ORDER — VENLAFAXINE HCL ER 150 MG PO CP24: ORAL_CAPSULE | ORAL | 1 refills | Status: DC

## 2022-10-04 NOTE — Progress Notes (Signed)
Virtual Visit via Video Note  I connected with Allen Conley on 10/04/22 at 10:40 AM EDT by a video enabled telemedicine application and verified that I am speaking with the correct person using two identifiers.  Location patient: [place] Location provider:work or home office Persons participating in the virtual visit: patient, provider  I discussed the limitations and requested verbal permission for telemedicine visit. The patient expressed understanding and agreed to proceed.  CC:  51 year old male being seen today for follow-up anxiety and mood disorder. I last saw him over a year ago--> 09/21/2021. A/P as of last visit: "#1: Treatment resistant depression/dysthymia. Has been on Effexor XR 300 mg daily.  Recent addition of Abilify 2 mg a day, then increase to 5 mg a day but has not taking it very consistently over the last month.  No side effects.  We will push dose to 10 mg daily.   2) Shingles: resolved and completely w/out pain now."  INTERIM HX: Ran out of meds and did not follow-up. Last several months has felt decreased motivation, depressed mood, poor appetite, anhedonia. No SI or HI.  Sleep is fine.  He is not exercising.  Says relationships with friends and family and coworkers are fine. He continues to work full-time. His anxiety level is pretty low, has not had any panic attacks in a couple of years.  Says the increase to 10 mg of Abilify at our last visit a year ago did help better than the 5 mg and he had no side effects.   PMP AWARE reviewed today: NOTHING LISTED. No red flags.   ROS: See pertinent positives and negatives per HPI.  Past Medical History:  Diagnosis Date   Depression    zoloft no help    GAD (generalized anxiety disorder)    Opioid abuse ([place]) 2018   Panic attacks    Serotonin syndrome 09/17/2014   on paxil + wellbutrin    History reviewed. No pertinent surgical history.   Current Outpatient Medications:    ARIPiprazole (ABILIFY) 10 MG tablet, Take 1  tablet (10 mg total) by mouth daily. (Patient not taking: Reported on 10/04/2022), Disp: 30 tablet, Rfl: 0   venlafaxine XR (EFFEXOR-XR) 150 MG 24 hr capsule, TAKE TWO CAPSULES BY MOUTH DAILY with breakfast (Patient not taking: Reported on 10/04/2022), Disp: 180 capsule, Rfl: 3  EXAM:  VITALS per patient if applicable:     [id number]/[id number]    1:36 PM 07/23/2021    4:11 PM 07/07/2021    6:18 PM  Vitals with BMI  Height 5\' 7"  5\' 7"    Weight 190 lbs 3 oz 187 lbs 10 oz   BMI 95.28 41.32   Systolic 440 102 725  Diastolic 68 69 77  Pulse 366 103 108     GENERAL: alert, oriented, appears well and in no acute distress  HEENT: atraumatic, conjunttiva clear, no obvious abnormalities on inspection of external nose and ears  NECK: normal movements of the head and neck  LUNGS: on inspection no signs of respiratory distress, breathing rate appears normal, no obvious gross SOB, gasping or wheezing  CV: no obvious cyanosis  MS: moves all visible extremities without noticeable abnormality  PSYCH/NEURO: pleasant and cooperative, no obvious depression or anxiety, speech and thought processing grossly intact  ASSESSMENT AND PLAN:  Discussed the following assessment and plan:  Recurrent major depressive disorder: Moderate, active. Most recent med regimen of Abilify 10 mg and Effexor XR 300 mg a day had been helpful. We will restart Abilify at  the one half of the 10 mg dose for at least a week and then may increase to the full dose. Additionally, restart Effexor at one of the 150 mg tabs a day for at least a week and then may increase to 2 tabs.  I discussed the assessment and treatment plan with the patient. The patient was provided an opportunity to ask questions and all were answered. The patient agreed with the plan and demonstrated an understanding of the instructions.   F/u: 4-6 wks  Signed:  Santiago Bumpers, MD           10/04/2022

## 2022-11-05 ENCOUNTER — Other Ambulatory Visit: Payer: Self-pay | Admitting: Family Medicine

## 2022-12-08 NOTE — Progress Notes (Signed)
This encounter was created in error - please disregard.

## 2022-12-20 ENCOUNTER — Ambulatory Visit
Admission: EM | Admit: 2022-12-20 | Discharge: 2022-12-20 | Discharge status: Absent without leave | Payer: Managed Care, Other (non HMO)

## 2022-12-21 ENCOUNTER — Ambulatory Visit: Payer: Self-pay

## 2022-12-21 ENCOUNTER — Ambulatory Visit
Admission: EM | Admit: 2022-12-21 | Discharge: 2022-12-21 | Disposition: A | Discharge status: Home / Self Care | Payer: Managed Care, Other (non HMO) | Attending: Nurse Practitioner | Admitting: Nurse Practitioner

## 2022-12-21 DIAGNOSIS — J069 Acute upper respiratory infection, unspecified: Secondary | ICD-10-CM | POA: Diagnosis not present

## 2022-12-21 DIAGNOSIS — Z1152 Encounter for screening for COVID-19: Secondary | ICD-10-CM

## 2022-12-21 MED ORDER — BENZONATATE 100 MG PO CAPS: 100.0000 mg | ORAL_CAPSULE | Freq: Three times a day (TID) | ORAL | 0 refills | Status: DC | PRN

## 2022-12-21 NOTE — ED Triage Notes (Signed)
Pt reports he has been having body aches, fatigue, sob, throat pain x 5 days. Took goody powder, nyquil, and alketler plus but only slight relief.

## 2022-12-21 NOTE — ED Provider Notes (Signed)
[facility]    CSN: [id number] Arrival date & time: 12/21/22  1136      History   Chief Complaint No chief complaint on file.   HPI Allen Conley is a 52 y.o. male.   Patient presents today for 5-day history of congested cough, shortness of breath with any activity or when laying down, upper chest congestion, headache, decreased appetite and fatigue.  He denies fever, body aches or chills, wheezing, chest pain or chest tightness, nasal congestion, sinus pressure, ear pain, abdominal pain, nausea/vomiting, diarrhea, loss of taste and smell, new rash, and known sick contacts.  He has been taking over-the-counter NyQuil, Alka-Seltzer, and Goody powder without benefit.  Patient denies history of chronic lung disease.  He also denies smoking or vaping history.    Past Medical History:  Diagnosis Date   Depression    zoloft no help    GAD (generalized anxiety disorder)    Opioid abuse ([place]) 2018   Panic attacks    Serotonin syndrome 09/17/2014   on paxil + wellbutrin    Patient Active Problem List   Diagnosis Date Noted   Moderate episode of recurrent major depressive disorder ([place])    Recurrent major depression ([place]) 12/24/2015   Viral gastroenteritis 12/25/2014   Bipolar II disorder ([employer]) 10/18/2014   Major depression 07/12/2014   GAD (generalized anxiety disorder) 07/12/2014   Panic attacks 07/12/2014    History reviewed. No pertinent surgical history.     Home Medications    Prior to Admission medications   Medication Sig Start Date End Date Taking? Authorizing Provider  benzonatate (TESSALON) 100 MG capsule Take 1 capsule (100 mg total) by mouth 3 (three) times daily as needed for cough. Do not take with alcohol or while driving or operating heavy machinery.  May cause drowsiness. 12/21/22  Yes Eulogio Bear, NP  ARIPiprazole (ABILIFY) 10 MG tablet TAKE 1 TABLET BY MOUTH DAILY 11/08/22   McGowen, Adrian Blackwater, MD  venlafaxine XR (EFFEXOR-XR) 150 MG 24  hr capsule TAKE TWO CAPSULES BY MOUTH DAILY with breakfast 10/04/22   McGowen, Adrian Blackwater, MD    Family History Family History  Problem Relation Age of Onset   Cancer Maternal Grandmother    Cancer Maternal Grandfather    Depression Mother    Drug abuse Brother     Social History Social History   Tobacco Use   Smoking status: Never   Smokeless tobacco: Never  Substance Use Topics   Alcohol use: No    Comment: socially   Drug use: No    Types: "Crack" cocaine     Allergies   Patient has no known allergies.   Review of Systems Review of Systems Per HPI  Physical Exam Triage Vital Signs ED Triage Vitals  Enc Vitals Group     BP 12/21/22 1155 (!) 127/91     Pulse Rate 12/21/22 1155 (!) 111     Resp 12/21/22 1155 20     Temp 12/21/22 1155 99.3 F (37.4 C)     Temp Source 12/21/22 1155 Oral     SpO2 12/21/22 1155 94 %     Weight --      Height --      Head Circumference --      Peak Flow --      Pain Score 12/21/22 1159 0     Pain Loc --      Pain Edu? --      Excl. in [place]? --  No data found.  Updated Vital Signs BP (!) 127/91 (BP Location: Right Arm)   Pulse (!) 111   Temp 99.3 F (37.4 C) (Oral)   Resp 20   SpO2 94%   Visual Acuity Right Eye Distance:   Left Eye Distance:   Bilateral Distance:    Right Eye Near:   Left Eye Near:    Bilateral Near:     Physical Exam Vitals and nursing note reviewed.  Constitutional:      General: He is not in acute distress.    Appearance: Normal appearance. He is not ill-appearing or toxic-appearing.  HENT:     Head: Normocephalic and atraumatic.     Right Ear: Tympanic membrane, ear canal and external ear normal.     Left Ear: Tympanic membrane, ear canal and external ear normal.     Nose: No congestion or rhinorrhea.     Mouth/Throat:     Mouth: Mucous membranes are moist.     Pharynx: Oropharynx is clear. Posterior oropharyngeal erythema present. No oropharyngeal exudate.  Eyes:     General: No  scleral icterus.    Extraocular Movements: Extraocular movements intact.  Cardiovascular:     Rate and Rhythm: Regular rhythm. Tachycardia present.  Pulmonary:     Effort: Pulmonary effort is normal. No respiratory distress.     Breath sounds: Normal breath sounds. No wheezing, rhonchi or rales.  Abdominal:     General: Abdomen is flat. Bowel sounds are normal. There is no distension.     Palpations: Abdomen is soft.     Tenderness: There is no abdominal tenderness. There is no guarding.  Musculoskeletal:     Cervical back: Normal range of motion and neck supple.  Lymphadenopathy:     Cervical: No cervical adenopathy.  Skin:    General: Skin is warm and dry.     Coloration: Skin is not jaundiced or pale.     Findings: No erythema or rash.  Neurological:     Mental Status: He is alert and oriented to person, place, and time.  Psychiatric:        Behavior: Behavior is cooperative.      UC Treatments / Results  Labs (all labs ordered are listed, but only abnormal results are displayed) Labs Reviewed - No data to display  EKG   Radiology No results found.  Procedures Procedures (including critical care time)  Medications Ordered in UC Medications - No data to display  Initial Impression / Assessment and Plan / UC Course  I have reviewed the triage vital signs and the nursing notes.  Pertinent labs & imaging results that were available during my care of the patient were reviewed by me and considered in my medical decision making (see chart for details).   Patient is well-appearing, normotensive, afebrile, not tachypneic, oxygenating well on room air.  Patient is mildly tachycardic in triage, this appears to be near the patient's baseline and is likely slightly exacerbated secondary to acute viral illness.  Viral URI with cough Encounter for screening for COVID-19 Suspect viral etiology COVID-19 testing deferred by patient which is reasonable at this time given length  of symptoms Discussed supportive care with patient including expectorant, increasing hydration, nasal saline rinses ER and return precautions discussed with patient Note given for work  The patient was given the opportunity to ask questions.  All questions answered to their satisfaction.  The patient is in agreement to this plan.    Final Clinical Impressions(s) / UC Diagnoses  Final diagnoses:  Viral URI with cough  Encounter for screening for COVID-19     Discharge Instructions      You have a viral upper respiratory infection.  Symptoms should improve over the next week to 10 days.  If you develop chest pain or shortness of breath, go to the emergency room.  Some things that can make you feel better are: - Increased rest - Increasing fluid with water/sugar free electrolytes - Acetaminophen and ibuprofen as needed for fever/pain - Salt water gargling, chloraseptic spray and throat lozenges for sore throat - OTC guaifenesin (Mucinex) 600 mg twice daily for congestion - Saline sinus flushes or a neti pot for congestion - Humidifying the air -Tessalon Perles every 8 hours during the day as needed for dry cough     ED Prescriptions     Medication Sig Dispense Auth. Provider   benzonatate (TESSALON) 100 MG capsule Take 1 capsule (100 mg total) by mouth 3 (three) times daily as needed for cough. Do not take with alcohol or while driving or operating heavy machinery.  May cause drowsiness. 21 capsule Valentino Nose, NP      PDMP not reviewed this encounter.   Valentino Nose, NP 12/21/22 1455

## 2022-12-21 NOTE — Discharge Instructions (Addendum)
You have a viral upper respiratory infection.  Symptoms should improve over the next week to 10 days.  If you develop chest pain or shortness of breath, go to the emergency room.  Some things that can make you feel better are: - Increased rest - Increasing fluid with water/sugar free electrolytes - Acetaminophen and ibuprofen as needed for fever/pain - Salt water gargling, chloraseptic spray and throat lozenges for sore throat - OTC guaifenesin (Mucinex) 600 mg twice daily for congestion - Saline sinus flushes or a neti pot for congestion - Humidifying the air -Tessalon Perles every 8 hours during the day as needed for dry cough 

## 2022-12-23 ENCOUNTER — Ambulatory Visit: Payer: Managed Care, Other (non HMO) | Admitting: Family Medicine

## 2022-12-23 NOTE — Progress Notes (Deleted)
OFFICE VISIT  12/23/2022  CC: No chief complaint on file.   Patient is a 52 y.o. male who presents for respiratory concerns.  HPI: ***  [facility] visit 12/21/2022, encounter reviewed today: Diagnosed with viral URI with cough, symptomatic care emphasized.  ***(DO did not discuss mood/anxiety problems with him today unless urgent)***  Past Medical History:  Diagnosis Date   Depression    zoloft no help    GAD (generalized anxiety disorder)    Opioid abuse ([facility]) 2018   Panic attacks    Serotonin syndrome 09/17/2014   on paxil + wellbutrin    No past surgical history on file.  Outpatient Medications Prior to Visit  Medication Sig Dispense Refill   ARIPiprazole (ABILIFY) 10 MG tablet TAKE 1 TABLET BY MOUTH DAILY 30 tablet 0   benzonatate (TESSALON) 100 MG capsule Take 1 capsule (100 mg total) by mouth 3 (three) times daily as needed for cough. Do not take with alcohol or while driving or operating heavy machinery.  May cause drowsiness. 21 capsule 0   venlafaxine XR (EFFEXOR-XR) 150 MG 24 hr capsule TAKE TWO CAPSULES BY MOUTH DAILY with breakfast 60 capsule 1   No facility-administered medications prior to visit.    No Known Allergies  Review of Systems  As per HPI  PE:    12/21/2022   11:55 AM 09/21/2021    1:36 PM 07/23/2021    4:11 PM  Vitals with BMI  Height  5' 7"$  5' 7"$   Weight  190 lbs 3 oz 187 lbs 10 oz  BMI  0000000 [id number]  Systolic [id number]  Diastolic 91 68 69  Pulse [id number] 105 103     Physical Exam  ***  LABS:  Last CBC Lab Results  Component Value Date   WBC 8.6 07/07/2021   HGB 17.2 (H) 07/07/2021   HCT 51.0 07/07/2021   MCV 95.3 07/07/2021   MCH 32.1 07/07/2021   RDW 12.0 07/07/2021   PLT 288 [id number]   Last metabolic panel Lab Results  Component Value Date   GLUCOSE 95 07/07/2021   NA 136 07/07/2021   K 4.2 07/07/2021   CL 101 07/07/2021   CO2 27 07/07/2021   BUN 11 07/07/2021   CREATININE 0.95 07/07/2021   GFRNONAA  >60 07/07/2021   CALCIUM 9.2 07/07/2021   PROT 7.2 10/09/2014   ALBUMIN 4.7 10/09/2014   BILITOT 0.4 10/09/2014   ALKPHOS 72 10/09/2014   AST 30 10/09/2014   ALT 18 10/09/2014   ANIONGAP 8 07/07/2021   IMPRESSION AND PLAN:  No problem-specific Assessment & Plan notes found for this encounter.   An After Visit Summary was printed and given to the patient.  FOLLOW UP: No follow-ups on file.  Signed:  Crissie Sickles, MD           12/23/2022

## 2023-03-18 ENCOUNTER — Encounter: Payer: Self-pay | Admitting: Family Medicine

## 2023-03-18 ENCOUNTER — Ambulatory Visit: Payer: Managed Care, Other (non HMO) | Admitting: Family Medicine

## 2023-03-18 VITALS — BP 125/86 | HR 109 | Wt 185.2 lb

## 2023-03-18 DIAGNOSIS — F331 Major depressive disorder, recurrent, moderate: Secondary | ICD-10-CM

## 2023-03-18 MED ORDER — VENLAFAXINE HCL ER 150 MG PO CP24: ORAL_CAPSULE | ORAL | 0 refills | Status: DC

## 2023-03-18 MED ORDER — ARIPIPRAZOLE 10 MG PO TABS: 10.0000 mg | ORAL_TABLET | Freq: Every day | ORAL | 0 refills | Status: DC

## 2023-03-18 NOTE — Progress Notes (Signed)
OFFICE VISIT  03/18/2023  CC:  Chief Complaint  Patient presents with   Follow-up    Follow up and med refills. No other concerns.    Patient is a 52 y.o. male who presents for 41-month follow-up depression. A/P as of last visit: "Recurrent major depressive disorder: Moderate, active. Most recent med regimen of Abilify 10 mg and Effexor XR 300 mg a day had been helpful. We will restart Abilify at the one half of the 10 mg dose for at least a week and then may increase to the full dose. Additionally, restart Effexor at one of the 150 mg tabs a day for at least a week and then may increase to 2 tabs."  INTERIM HX: Lean only took his medication intermittently after last visit and he eventually ran out of it.  He notes he does feel significantly more depressed without his medication.  He has no motivation.  He has anhedonia, sleep dysfunction, poor concentration and focus. Some mild anxiety but no panic.  He does not really want to leave the house because he says he is not motivated, feels apathetic. Denies homicidal or suicidal ideation. No alcohol or drug use. He says that he works 5 to 6 days a week and when he is not working he is at home sleeping. He is not exercising  Past Medical History:  Diagnosis Date   Depression    zoloft no help    GAD (generalized anxiety disorder)    Opioid abuse 2018   Panic attacks    Serotonin syndrome 09/17/2014   on paxil + wellbutrin    No past surgical history on file.  Outpatient Medications Prior to Visit  Medication Sig Dispense Refill   ARIPiprazole (ABILIFY) 10 MG tablet TAKE 1 TABLET BY MOUTH DAILY 30 tablet 0   venlafaxine XR (EFFEXOR-XR) 150 MG 24 hr capsule TAKE TWO CAPSULES BY MOUTH DAILY with breakfast 60 capsule 1   benzonatate (TESSALON) 100 MG capsule Take 1 capsule (100 mg total) by mouth 3 (three) times daily as needed for cough. Do not take with alcohol or while driving or operating heavy machinery.  May cause drowsiness. 21  capsule 0   No facility-administered medications prior to visit.    No Known Allergies  Review of Systems As per HPI  PE:    03/18/2023    3:14 PM 12/21/2022   11:55 AM 09/21/2021    1:36 PM  Vitals with BMI  Height   5\' 7"   Weight 185 lbs 3 oz  190 lbs 3 oz  BMI   29.78  Systolic 125 127 812  Diastolic 86 91 68  Pulse 109 111 105     Physical Exam  Gen: Alert, well appearing.  Patient is oriented to person, place, time, and situation. AFFECT: pleasant, lucid thought and speech. No further exam today  LABS:  None  IMPRESSION AND PLAN:  Recurrent major depressive disorder, treatment resistant, active.  Relatively noncompliant with medications. Fortunately, he remains functional from a work standpoint. We agreed to get back on the medication but restart slowly.  He will start Effexor 150 mg tab once a day for 10 days and then increase to 2/day.  He will also start his Abilify one half of a 10 mg tab daily for 10 days and then increase to whole. He is interested in seeing a psychiatrist and a counselor so I have made referrals for those today.  An After Visit Summary was printed and given to the patient.  FOLLOW UP: No follow-ups on file.  Signed:  Santiago BumpersPhil Jaquay Posthumus, MD           03/18/2023

## 2023-06-27 NOTE — Patient Instructions (Signed)
It was very nice to see you today!   PLEASE NOTE:   If you had any lab tests please let us know if you have not heard back within a few days. You may see your results on MyChart before we have a chance to review them but we will give you a call once they are reviewed by us. If we ordered any referrals today, please let us know if you have not heard from their office within the next 2 weeks. You should receive a letter via MyChart confirming if the referral was approved and their office contact information to schedule.  

## 2023-06-28 ENCOUNTER — Telehealth: Payer: Managed Care, Other (non HMO) | Admitting: Family Medicine

## 2023-06-28 ENCOUNTER — Encounter: Payer: Self-pay | Admitting: Family Medicine

## 2023-06-28 VITALS — Wt 185.0 lb

## 2023-06-28 DIAGNOSIS — F329 Major depressive disorder, single episode, unspecified: Secondary | ICD-10-CM | POA: Diagnosis not present

## 2023-06-28 MED ORDER — LITHIUM CARBONATE 300 MG PO CAPS: 300.0000 mg | ORAL_CAPSULE | Freq: Two times a day (BID) | ORAL | 0 refills | Status: DC

## 2023-06-28 MED ORDER — VENLAFAXINE HCL ER 150 MG PO CP24: ORAL_CAPSULE | ORAL | 1 refills | Status: AC

## 2023-06-28 MED ORDER — ARIPIPRAZOLE 10 MG PO TABS: 10.0000 mg | ORAL_TABLET | Freq: Every day | ORAL | 1 refills | Status: DC

## 2023-06-28 NOTE — Progress Notes (Signed)
Virtual Visit via Video Note  I connected with Denney  on 06/28/23 at 10:40 AM EDT by a video enabled telemedicine application and verified that I am speaking with the correct person using two identifiers.  Location patient: [place] Location provider:work or home office Persons participating in the virtual visit: patient, provider  I discussed the limitations and requested verbal permission for telemedicine visit. The patient expressed understanding and agreed to proceed.  CC:  52 year old male being seen today for 1-month follow-up treatment resistant depression. A/P as of last visit: "Recurrent major depressive disorder, treatment resistant, active.  Relatively noncompliant with medications. Fortunately, he remains functional from a work standpoint. We agreed to get back on the medication but restart slowly.  He will start Effexor 150 mg tab once a day for 10 days and then increase to 2/day.  He will also start his Abilify one half of a 10 mg tab daily for 10 days and then increase to whole. He is interested in seeing a psychiatrist and a counselor so I have made referrals for those today."  INTERIM HX: Feels like his mood has been more stable--no significant severe dips in mood.  However, he remains at a chronic level of depression.  Poor appetite, sleep dysfunction, anhedonia, hopelessness, withdrawing socially.  He does not exercise. He denies SI or HI. He does still continue to work.  Denies any side effects from the Abilify or Effexor.  ROS as above, plus--> no fevers, no CP, no SOB, no wheezing, no cough, no dizziness, no HAs, no rashes, no melena/hematochezia.  No polyuria or polydipsia.  No myalgias or arthralgias.  No focal weakness, paresthesias, or tremors.  No acute vision or hearing abnormalities.  No dysuria or unusual/new urinary urgency or frequency.  No recent changes in lower legs. No n/v/d or abd pain.  No palpitations.    ROS: See pertinent positives and negatives per  HPI.  Past Medical History:  Diagnosis Date   Depression    zoloft no help    GAD (generalized anxiety disorder)    Opioid abuse ([facility]) 2018   Panic attacks    Serotonin syndrome 09/17/2014   on paxil + wellbutrin    History reviewed. No pertinent surgical history.   Current Outpatient Medications:    ARIPiprazole (ABILIFY) 10 MG tablet, Take 1 tablet (10 mg total) by mouth daily., Disp: 30 tablet, Rfl: 0   venlafaxine XR (EFFEXOR-XR) 150 MG 24 hr capsule, TAKE TWO CAPSULES BY MOUTH DAILY with breakfast, Disp: 60 capsule, Rfl: 0  EXAM:  VITALS per patient if applicable:     06/28/2023   10:38 AM 03/18/2023    3:14 PM 12/21/2022   11:55 AM  Vitals with BMI  Weight 185 lbs 185 lbs 3 oz   Systolic  125 127  Diastolic  86 91  Pulse  109 111     GENERAL: alert, oriented, appears well and in no acute distress  HEENT: atraumatic, conjunttiva clear, no obvious abnormalities on inspection of external nose and ears  NECK: normal movements of the head and neck  LUNGS: on inspection no signs of respiratory distress, breathing rate appears normal, no obvious gross SOB, gasping or wheezing  CV: no obvious cyanosis  MS: moves all visible extremities without noticeable abnormality  PSYCH/NEURO: pleasant and cooperative, no obvious depression or anxiety, speech and thought processing grossly intact  LABS: none today.  ASSESSMENT AND PLAN:  Discussed the following assessment and plan:  Treatment resistant depression. He is more stable but  has lots of improvement to go. Add lithium 300 mg twice daily.  Therapeutic expectations and side effect profile of medication discussed today.  Patient's questions answered. Continue Abilify 10 mg a day and Effexor XR 300 mg a day. At follow-up in 1 week we will check lithium level, CBC, c-Met, and TSH.  I discussed the assessment and treatment plan with the patient. The patient was provided an opportunity to ask questions and all were  answered. The patient agreed with the plan and demonstrated an understanding of the instructions.   F/u: 1 week  Signed:  Santiago Bumpers, MD           06/28/2023

## 2023-12-22 ENCOUNTER — Other Ambulatory Visit: Payer: Self-pay | Admitting: Family Medicine

## 2024-01-18 ENCOUNTER — Ambulatory Visit: Payer: Self-pay | Admitting: Family Medicine

## 2024-01-18 NOTE — Telephone Encounter (Signed)
  Chief Complaint: flu exposure Symptoms: body aches, headache, sore throat, fatigue  Frequency: constant   Disposition: [] ED /[] Urgent Care (no appt availability in office) / [x] Appointment(In office/virtual)/ []  [facility]/ [] Home Care/ [] Refused Recommended Disposition /[] [facility]/ []  Follow-up with PCP Additional Notes: Pt complaining of body aches, headache, sore throat, and fatigue. Pt was exposed to flu and started feeling bad on Sunday. Pt feels like he has a fever, but never checked it. Pt requested a virtual appt due to driving an hour to get to dr office. Pt feels he is too weak to drive. RN gave care advice and pt verbalized understanding.             Copied from CRM [id number]. Topic: Clinical - Red Word Triage >> Jan 18, 2024  4:38 PM Mercedes MATSU wrote: Red Word that prompted transfer to Nurse Triage: Patient is having flu like symptoms, is requesting medication. Reason for Disposition  Fever present > 3 days (72 hours)  Fever present > 3 days (72 hours)  Protocols used: Influenza (Flu) Exposure-A-AH, Influenza (Flu) - Seasonal-A-AH

## 2024-01-19 ENCOUNTER — Telehealth: Payer: Managed Care, Other (non HMO) | Admitting: Family Medicine

## 2024-01-19 ENCOUNTER — Encounter: Payer: Self-pay | Admitting: Family Medicine

## 2024-01-19 VITALS — Temp 98.7°F

## 2024-01-19 DIAGNOSIS — J111 Influenza due to unidentified influenza virus with other respiratory manifestations: Secondary | ICD-10-CM | POA: Diagnosis not present

## 2024-01-19 MED ORDER — HYDROCODONE BIT-HOMATROP MBR 5-1.5 MG/5ML PO SOLN: ORAL | 0 refills | Status: DC

## 2024-01-19 NOTE — Progress Notes (Signed)
 Virtual Visit via Video Note  I connected with Allen Conley  on 01/19/24 at 11:00 AM EST by a video enabled telemedicine application and verified that I am speaking with the correct person using two identifiers.  Location patient: [place] Location provider:work or home office Persons participating in the virtual visit: patient, provider  I discussed the limitations and requested verbal permission for telemedicine visit. The patient expressed understanding and agreed to proceed.   HPI: 53 year old male being seen today for cough. Onset 5 days ago.  Also sore throat, decreased appetite, fatigue, body aches, subjective fever, and headache. He had exposure to flu a couple days prior to onset of his symptoms. No nausea, vomiting, diarrhea, or rash.  ROS: See pertinent positives and negatives per HPI.  Past Medical History:  Diagnosis Date   Depression    zoloft no help    GAD (generalized anxiety disorder)    Opioid abuse ([facility]) 2018   Panic attacks    Serotonin syndrome 09/17/2014   on paxil  + wellbutrin     History reviewed. No pertinent surgical history.   Current Outpatient Medications:    ARIPiprazole  (ABILIFY ) 10 MG tablet, Take 1 tablet (10 mg total) by mouth daily., Disp: 90 tablet, Rfl: 1   lithium  carbonate 300 MG capsule, Take 1 capsule (300 mg total) by mouth 2 (two) times daily with a meal., Disp: 30 capsule, Rfl: 0   venlafaxine  XR (EFFEXOR -XR) 150 MG 24 hr capsule, TAKE TWO CAPSULES BY MOUTH DAILY with breakfast, Disp: 180 capsule, Rfl: 1  EXAM:  VITALS per patient if applicable:     06/28/2023   10:38 AM 03/18/2023    3:14 PM 12/21/2022   11:55 AM  Vitals with BMI  Weight 185 lbs 185 lbs 3 oz   Systolic  125 127  Diastolic  86 91  Pulse  109 111     GENERAL: alert, oriented, appears well and in no acute distress  HEENT: atraumatic, conjunttiva clear, no obvious abnormalities on inspection of external nose and ears  NECK: normal movements of the head and neck  LUNGS:  on inspection no signs of respiratory distress, breathing rate appears normal, no obvious gross SOB, gasping or wheezing  CV: no obvious cyanosis  MS: moves all visible extremities without noticeable abnormality  PSYCH/NEURO: pleasant and cooperative, no obvious depression or anxiety, speech and thought processing grossly intact  LABS: none today  ASSESSMENT AND PLAN:  Discussed the following assessment and plan:  Influenza-like illness, exposure to flu. He is beyond the treatment window for Tamiflu. Today I prescribed Hycodan syrup, 1 to 2 teaspoons twice daily as needed, 120 mL. He may continue ibuprofen 400 to 600 mg twice a day as needed. Saline nasal spray recommended. Emphasize need to adequately hydrate and rest.   I discussed the assessment and treatment plan with the patient. The patient was provided an opportunity to ask questions and all were answered. The patient agreed with the plan and demonstrated an understanding of the instructions.   F/u: If not significantly improving in 3 to 4 days.  Signed:  [name], MD           01/19/2024

## 2024-01-19 NOTE — Telephone Encounter (Signed)
 No further action needed.

## 2024-11-26 ENCOUNTER — Telehealth: Payer: Self-pay

## 2024-11-26 NOTE — Telephone Encounter (Signed)
 Received forms via fax. Per Dr. McGowen patient will require an appt to complete forms.  I left patient a detailed message for him to call office to schedule appt. -Seira Cody

## 2024-11-26 NOTE — Telephone Encounter (Signed)
 Patient returned call to [facility] and stated that he will have forms refaxed. I made sure that he had the correct fax number. He would like for office to notify him via fax or mychart when the forms have been received.

## 2024-11-26 NOTE — Telephone Encounter (Signed)
 LVM for pt to return call regarding forms.   We did not receive any forms for him

## 2024-11-26 NOTE — Telephone Encounter (Signed)
 Patient is scheduled for 12/18 at 4pm  Forms are located in [facility] inbox front office

## 2024-11-26 NOTE — Telephone Encounter (Signed)
 Can you let me know when the forms have been received?

## 2024-11-26 NOTE — Telephone Encounter (Signed)
 FYI reviewed

## 2024-11-26 NOTE — Telephone Encounter (Signed)
 Copied from CRM [id number]. Topic: General - Other >> Nov 26, 2024 11:03 AM Revonda D wrote: Reason for CRM: Pt is calling to confirm if the office received the forms faxed from the court. Pt stated that the forms are to see if he is well enough to drive due to getting pulled over and experiencing a panic attack. Pt stated that the forms were faxed on 11/24 and hasn't received an update. Pt stated that his license has been suspended as of now since the court hasn't received the forms. Pt would like a callback today with an update regarding this concern.

## 2024-11-29 ENCOUNTER — Ambulatory Visit: Admitting: Family Medicine

## 2024-11-29 ENCOUNTER — Encounter: Payer: Self-pay | Admitting: Family Medicine

## 2024-11-29 VITALS — BP 135/91 | HR 118 | Temp 98.0°F | Ht 67.5 in | Wt 190.0 lb

## 2024-11-29 DIAGNOSIS — F410 Panic disorder [episodic paroxysmal anxiety] without agoraphobia: Secondary | ICD-10-CM

## 2024-11-29 DIAGNOSIS — Z79899 Other long term (current) drug therapy: Secondary | ICD-10-CM

## 2024-11-29 DIAGNOSIS — F329 Major depressive disorder, single episode, unspecified: Secondary | ICD-10-CM

## 2024-11-29 DIAGNOSIS — F411 Generalized anxiety disorder: Secondary | ICD-10-CM

## 2024-11-29 DIAGNOSIS — Z0279 Encounter for issue of other medical certificate: Secondary | ICD-10-CM

## 2024-11-29 MED ORDER — ARIPIPRAZOLE 15 MG PO TABS: 15.0000 mg | ORAL_TABLET | Freq: Every day | ORAL | 0 refills | Status: AC

## 2024-11-29 NOTE — Progress Notes (Signed)
 Office Note 11/29/2024  CC:  Chief Complaint  Allen Conley presents with   Medical Management of Chronic Issues   Allen Conley is a 53 y.o. male who is here for annual health maintenance exam and follow-up depression and anxiety. I last saw him on 06/28/2023. A/P as of that visit: Treatment resistant depression. He is more stable but has lots of improvement to go. Add lithium  300 mg twice daily.  Therapeutic expectations and side effect profile of medication discussed today.  Allen Conley's questions answered. Continue Abilify  10 mg a day and Effexor  XR 300 mg a day. At follow-up in 1 week we will check lithium  level, CBC, c-Met, and TSH.  INTERIM HX: Larsen stopped his Abilify  and Effexor  not long after I saw him last.  He only took the lithium  a few weeks. He says they were ineffective. Has had high anxiety levels and periods of panic as well as depressed mood. This has all peaked in intensity in the last couple of months.  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  Not long after he was changed over to nights he restarted his Abilify  and Effexor .  He is taking 10 mg Abilify  and 2 of the 150 mg Effexor  XR tabs daily.  He says this has helped only a little bit.  He then got pulled over by the police for expired license.  He was very nervous, fighting off a panic attack and the police felt like he was acting suspicious so they searched his car.  When they gave him the go ahead to get in his car and go home he says he had an episode of severe panic that resulted in syncope.  He says that the officers felt like he was on drugs.  He failed their field sobriety testing because he could not balance on 1 foot. He was charged with driving under the influence.  He says the toxicology testing has not returned.  This was about 6 weeks ago. After the charges he was given a DMV driving health form for his health provider to fill out. His license was  then revoked because he did not get the form completed.  He is here today for completion of the form.    Past Medical History:  Diagnosis Date   Depression    zoloft no help    GAD (generalized anxiety disorder)    Opioid abuse ([facility]) 2018   Panic attacks    Serotonin syndrome 09/17/2014   on paxil  + wellbutrin     History reviewed. No pertinent surgical history.  Family History  Problem Relation Age of Onset   Cancer Maternal Grandmother    Cancer Maternal Grandfather    Depression Mother    Drug abuse Brother     Social History   Socioeconomic History   Marital status: Single    Spouse name: Not on file   Number of children: Not on file   Years of education: Not on file   Highest education level: Not on file  Occupational History   Not on file  Tobacco Use   Smoking status: Never   Smokeless tobacco: Never  Substance and Sexual Activity   Alcohol use: No    Comment: socially   Drug use: No    Types: Crack cocaine   Sexual activity: Never  Other Topics Concern   Not on file  Social History Narrative   Single, no children.   Assoc degree  [employer].   OCC: works for [employer] in [place].   Lives in [place], [place].   No tobacco, occ alcohol (beer).   +hx of polysubstance abuse.   Exercise: lifts weights 5 d/week, runs Sat/Sun   Social Drivers of Health   Tobacco Use: Low Risk (11/29/2024)   Allen Conley History    Smoking Tobacco Use: Never    Smokeless Tobacco Use: Never    Passive Exposure: Not on file  Financial Resource Strain: Not on file  Food Insecurity: Not on file  Transportation Needs: Not on file  Physical Activity: Not on file  Stress: Not on file  Social Connections: Not on file  Intimate Partner Violence: Not on file  Depression (PHQ2-9): High Risk (11/29/2024)   Depression (PHQ2-9)    PHQ-2 Score: 11  Alcohol Screen: Not on file  Housing: Not on file  Utilities: Not on file  Health Literacy: Not on file    Outpatient Medications Prior to  Visit  Medication Sig Dispense Refill   venlafaxine  XR (EFFEXOR -XR) 150 MG 24 hr capsule TAKE TWO CAPSULES BY MOUTH DAILY with breakfast 180 capsule 1   ARIPiprazole  (ABILIFY ) 10 MG tablet Take 1 tablet (10 mg total) by mouth daily. 90 tablet 1   HYDROcodone  bit-homatropine (HYCODAN) 5-1.5 MG/5ML syrup 1-2 tsp po bid as needed for cough (Allen Conley not taking: Reported on 11/29/2024) 120 mL 0   lithium  carbonate 300 MG capsule Take 1 capsule (300 mg total) by mouth 2 (two) times daily with a meal. (Allen Conley not taking: Reported on 11/29/2024) 30 capsule 0   No facility-administered medications prior to visit.    Allergies[1]   PE;    11/29/2024    4:20 PM 06/28/2023   10:38 AM 03/18/2023    3:14 PM  Vitals with BMI  Height 5' 7.5    Weight 190 lbs 185 lbs 185 lbs 3 oz  BMI 29.3    Systolic 135  125  Diastolic 91  86  Pulse 118  109     Gen: Alert, well appearing.  Allen Conley is oriented to person, place, time, and situation. AFFECT: pleasant, lucid thought and speech. Neuro: CN 2-12 intact bilaterally, strength 5/5 in proximal and distal upper extremities and lower extremities bilaterally.  =  No tremor.  No disdiadochokinesis.  No ataxia.     Pertinent labs:  Lab Results  Component Value Date   TSH 1.354 10/09/2014   Lab Results  Component Value Date   WBC 8.6 07/07/2021   HGB 17.2 (H) 07/07/2021   HCT 51.0 07/07/2021   MCV 95.3 07/07/2021   PLT 288 07/07/2021   Lab Results  Component Value Date   CREATININE 0.95 07/07/2021   BUN 11 07/07/2021   NA 136 07/07/2021   K 4.2 07/07/2021   CL 101 07/07/2021   CO2 27 07/07/2021   Lab Results  Component Value Date   ALT 18 10/09/2014   AST 30 10/09/2014   ALKPHOS 72 10/09/2014   BILITOT 0.4 10/09/2014   ASSESSMENT AND PLAN:   Recurrent major depressive disorder, treatment resistant. GAD with panic disorder.  He has been largely noncompliant with medication.  States this is because medications have never really  worked that much for him.  He has failed to improve with psychotherapy in the past as well.  Although he is historically noncompliant with his medication I do not feel he is a danger driving, does not need any restrictions. Will do urine drug screen today. Will fill out this health  form as well.  I will increase his Abilify  to 15 mg a day.  Continue Effexor  XR 300 mg a day.  I personally spent a total of 36 minutes in the care of the Allen Conley today including preparing to see the Allen Conley, getting/reviewing separately obtained history, performing a medically appropriate exam/evaluation, counseling and educating, placing orders, and documenting clinical information in the EHR.  An After Visit Summary was printed and given to the Allen Conley.  FOLLOW UP:  Return in about 4 weeks (around 12/27/2024) for routine chronic illness f/u.  Signed:  [name], MD           11/29/2024     [1] No Known Allergies

## 2024-11-30 LAB — DRUG MONITOR, PANEL 1, SCREEN, URINE
Amphetamines: POSITIVE ng/mL — AB
Barbiturates: NEGATIVE ng/mL
Benzodiazepines: NEGATIVE ng/mL
Cocaine Metabolite: NEGATIVE ng/mL
Creatinine: 198.8 mg/dL
Marijuana Metabolite: NEGATIVE ng/mL
Methadone Metabolite: NEGATIVE ng/mL
Opiates: NEGATIVE ng/mL
Oxidant: NEGATIVE ug/mL
Oxycodone: NEGATIVE ng/mL
Phencyclidine: NEGATIVE ng/mL
pH: 5.6 (ref 4.5–9.0)

## 2024-11-30 LAB — DM TEMPLATE

## 2024-12-31 ENCOUNTER — Ambulatory Visit: Admitting: Family Medicine

## 2025-01-08 ENCOUNTER — Ambulatory Visit: Admitting: Family Medicine
